# Patient Record
Sex: Female | Born: 1940
Health system: Southern US, Community
[De-identification: ages and names within clinical notes are randomized; demographics above are authoritative.]

## PROBLEM LIST (undated history)

## (undated) DIAGNOSIS — E119 Type 2 diabetes mellitus without complications: Secondary | ICD-10-CM

## (undated) DIAGNOSIS — F32A Depression, unspecified: Secondary | ICD-10-CM

## (undated) DIAGNOSIS — I10 Essential (primary) hypertension: Secondary | ICD-10-CM

## (undated) DIAGNOSIS — F329 Major depressive disorder, single episode, unspecified: Secondary | ICD-10-CM

## (undated) DIAGNOSIS — E7800 Pure hypercholesterolemia, unspecified: Secondary | ICD-10-CM

## (undated) HISTORY — DX: Essential (primary) hypertension: I10

## (undated) HISTORY — DX: Major depressive disorder, single episode, unspecified: F32.9

## (undated) HISTORY — PX: REDUCTION MAMMAPLASTY: SUR839

## (undated) HISTORY — DX: Depression, unspecified: F32.A

## (undated) HISTORY — DX: Pure hypercholesterolemia, unspecified: E78.00

## (undated) HISTORY — DX: Type 2 diabetes mellitus without complications: E11.9

---

## 1978-03-01 HISTORY — PX: BREAST REDUCTION SURGERY: SHX8

## 1989-03-01 HISTORY — PX: REFRACTIVE SURGERY: SHX103

## 1990-03-01 HISTORY — PX: VAGINAL HYSTERECTOMY: SHX2639

## 1997-06-28 ENCOUNTER — Other Ambulatory Visit: Admission: RE | Admit: 1997-06-28 | Discharge: 1997-06-28 | Payer: Self-pay | Admitting: Obstetrics and Gynecology

## 1999-01-28 ENCOUNTER — Encounter: Admission: RE | Admit: 1999-01-28 | Discharge: 1999-01-28 | Payer: Self-pay | Admitting: Obstetrics and Gynecology

## 1999-01-28 ENCOUNTER — Encounter: Payer: Self-pay | Admitting: Obstetrics and Gynecology

## 1999-03-16 ENCOUNTER — Encounter: Payer: Self-pay | Admitting: Gynecology

## 1999-03-23 ENCOUNTER — Inpatient Hospital Stay (HOSPITAL_COMMUNITY): Admission: RE | Admit: 1999-03-23 | Discharge: 1999-03-25 | Payer: Self-pay | Admitting: Obstetrics and Gynecology

## 2000-01-19 ENCOUNTER — Encounter: Admission: RE | Admit: 2000-01-19 | Discharge: 2000-01-19 | Payer: Self-pay | Admitting: Obstetrics and Gynecology

## 2000-01-19 ENCOUNTER — Encounter: Payer: Self-pay | Admitting: Obstetrics and Gynecology

## 2001-01-04 ENCOUNTER — Encounter: Admission: RE | Admit: 2001-01-04 | Discharge: 2001-01-04 | Payer: Self-pay | Admitting: Rheumatology

## 2001-01-04 ENCOUNTER — Encounter: Payer: Self-pay | Admitting: Specialist

## 2001-01-09 ENCOUNTER — Ambulatory Visit (HOSPITAL_BASED_OUTPATIENT_CLINIC_OR_DEPARTMENT_OTHER): Admission: RE | Admit: 2001-01-09 | Discharge: 2001-01-10 | Payer: Self-pay | Admitting: Specialist

## 2002-01-17 ENCOUNTER — Encounter: Admission: RE | Admit: 2002-01-17 | Discharge: 2002-01-17 | Payer: Self-pay | Admitting: Obstetrics and Gynecology

## 2002-01-17 ENCOUNTER — Encounter: Payer: Self-pay | Admitting: Obstetrics and Gynecology

## 2003-03-13 ENCOUNTER — Encounter: Admission: RE | Admit: 2003-03-13 | Discharge: 2003-03-13 | Payer: Self-pay | Admitting: Obstetrics and Gynecology

## 2004-08-07 ENCOUNTER — Emergency Department (HOSPITAL_COMMUNITY): Admission: EM | Admit: 2004-08-07 | Discharge: 2004-08-07 | Payer: Self-pay | Admitting: Emergency Medicine

## 2005-01-06 ENCOUNTER — Ambulatory Visit (HOSPITAL_COMMUNITY): Admission: RE | Admit: 2005-01-06 | Discharge: 2005-01-06 | Payer: Self-pay | Admitting: Obstetrics and Gynecology

## 2005-08-06 ENCOUNTER — Emergency Department (HOSPITAL_COMMUNITY): Admission: EM | Admit: 2005-08-06 | Discharge: 2005-08-06 | Payer: Self-pay | Admitting: Family Medicine

## 2006-02-01 ENCOUNTER — Ambulatory Visit (HOSPITAL_COMMUNITY): Admission: RE | Admit: 2006-02-01 | Discharge: 2006-02-01 | Payer: Self-pay | Admitting: Obstetrics and Gynecology

## 2006-06-23 ENCOUNTER — Encounter: Admission: RE | Admit: 2006-06-23 | Discharge: 2006-06-23 | Payer: Self-pay | Admitting: Internal Medicine

## 2007-03-08 ENCOUNTER — Ambulatory Visit (HOSPITAL_COMMUNITY): Admission: RE | Admit: 2007-03-08 | Discharge: 2007-03-08 | Payer: Self-pay | Admitting: Obstetrics and Gynecology

## 2007-07-11 ENCOUNTER — Other Ambulatory Visit: Admission: RE | Admit: 2007-07-11 | Discharge: 2007-07-11 | Payer: Self-pay | Admitting: Cardiology

## 2008-11-15 ENCOUNTER — Ambulatory Visit (HOSPITAL_COMMUNITY): Admission: RE | Admit: 2008-11-15 | Discharge: 2008-11-15 | Payer: Self-pay | Admitting: Internal Medicine

## 2009-05-02 ENCOUNTER — Emergency Department (HOSPITAL_COMMUNITY): Admission: EM | Admit: 2009-05-02 | Discharge: 2009-05-02 | Payer: Self-pay | Admitting: Emergency Medicine

## 2009-12-11 ENCOUNTER — Ambulatory Visit (HOSPITAL_COMMUNITY): Admission: RE | Admit: 2009-12-11 | Discharge: 2009-12-11 | Payer: Self-pay | Admitting: Internal Medicine

## 2010-07-26 ENCOUNTER — Inpatient Hospital Stay (INDEPENDENT_AMBULATORY_CARE_PROVIDER_SITE_OTHER)
Admission: RE | Admit: 2010-07-26 | Discharge: 2010-07-26 | Disposition: A | Discharge status: Home / Self Care | Payer: Medicare Other | Source: Ambulatory Visit | Attending: Emergency Medicine | Admitting: Emergency Medicine

## 2010-07-26 DIAGNOSIS — R509 Fever, unspecified: Secondary | ICD-10-CM

## 2010-07-26 LAB — DIFFERENTIAL
Basophils Absolute: 0 10*3/uL (ref 0.0–0.1)
Basophils Relative: 0 % (ref 0–1)
Eosinophils Relative: 2 % (ref 0–5)
Lymphs Abs: 2 10*3/uL (ref 0.7–4.0)
Monocytes Absolute: 0.7 10*3/uL (ref 0.1–1.0)

## 2010-07-26 LAB — CBC
HCT: 39.8 % (ref 36.0–46.0)
Hemoglobin: 13.5 g/dL (ref 12.0–15.0)
Platelets: 222 10*3/uL (ref 150–400)

## 2010-07-26 LAB — POCT URINALYSIS DIP (DEVICE)
Glucose, UA: NEGATIVE mg/dL
Nitrite: NEGATIVE
Protein, ur: NEGATIVE mg/dL
pH: 5 (ref 5.0–8.0)

## 2010-12-08 ENCOUNTER — Other Ambulatory Visit (HOSPITAL_COMMUNITY): Payer: Self-pay | Admitting: Internal Medicine

## 2010-12-08 DIAGNOSIS — Z1231 Encounter for screening mammogram for malignant neoplasm of breast: Secondary | ICD-10-CM

## 2010-12-29 ENCOUNTER — Ambulatory Visit (HOSPITAL_COMMUNITY)
Admission: RE | Admit: 2010-12-29 | Discharge: 2010-12-29 | Disposition: A | Discharge status: Home / Self Care | Payer: Medicare Other | Source: Ambulatory Visit | Attending: Internal Medicine | Admitting: Internal Medicine

## 2010-12-29 DIAGNOSIS — Z1231 Encounter for screening mammogram for malignant neoplasm of breast: Secondary | ICD-10-CM

## 2012-08-04 ENCOUNTER — Other Ambulatory Visit: Payer: Self-pay | Admitting: Gastroenterology

## 2012-08-04 DIAGNOSIS — R1310 Dysphagia, unspecified: Secondary | ICD-10-CM

## 2012-08-09 ENCOUNTER — Ambulatory Visit
Admission: RE | Admit: 2012-08-09 | Discharge: 2012-08-09 | Disposition: A | Discharge status: Home / Self Care | Payer: Medicare Other | Source: Ambulatory Visit | Attending: Gastroenterology | Admitting: Gastroenterology

## 2012-08-09 DIAGNOSIS — R1310 Dysphagia, unspecified: Secondary | ICD-10-CM

## 2013-11-15 ENCOUNTER — Ambulatory Visit: Payer: Medicare HMO | Attending: Internal Medicine | Admitting: Physical Therapy

## 2013-11-15 DIAGNOSIS — M545 Low back pain, unspecified: Secondary | ICD-10-CM | POA: Insufficient documentation

## 2013-11-15 DIAGNOSIS — IMO0001 Reserved for inherently not codable concepts without codable children: Secondary | ICD-10-CM | POA: Diagnosis present

## 2013-11-22 ENCOUNTER — Ambulatory Visit: Payer: Medicare HMO | Admitting: Physical Therapy

## 2013-11-22 DIAGNOSIS — IMO0001 Reserved for inherently not codable concepts without codable children: Secondary | ICD-10-CM | POA: Diagnosis not present

## 2013-11-26 ENCOUNTER — Ambulatory Visit: Payer: Medicare HMO | Admitting: Physical Therapy

## 2013-11-26 DIAGNOSIS — IMO0001 Reserved for inherently not codable concepts without codable children: Secondary | ICD-10-CM | POA: Diagnosis not present

## 2013-11-28 ENCOUNTER — Encounter: Payer: Medicare Other | Admitting: Physical Therapy

## 2013-12-03 ENCOUNTER — Ambulatory Visit: Payer: Medicare HMO | Attending: Internal Medicine | Admitting: Rehabilitation

## 2013-12-03 DIAGNOSIS — Z5189 Encounter for other specified aftercare: Secondary | ICD-10-CM | POA: Insufficient documentation

## 2013-12-03 DIAGNOSIS — M545 Low back pain: Secondary | ICD-10-CM | POA: Insufficient documentation

## 2013-12-05 ENCOUNTER — Encounter: Payer: Medicare Other | Admitting: Physical Therapy

## 2013-12-06 ENCOUNTER — Encounter: Payer: Self-pay | Admitting: Internal Medicine

## 2013-12-10 ENCOUNTER — Ambulatory Visit: Payer: Medicare HMO | Admitting: Physical Therapy

## 2013-12-10 DIAGNOSIS — Z5189 Encounter for other specified aftercare: Secondary | ICD-10-CM | POA: Diagnosis not present

## 2013-12-12 ENCOUNTER — Ambulatory Visit: Payer: Medicare HMO | Admitting: Physical Therapy

## 2013-12-12 DIAGNOSIS — Z5189 Encounter for other specified aftercare: Secondary | ICD-10-CM | POA: Diagnosis not present

## 2013-12-24 ENCOUNTER — Encounter: Payer: Medicare HMO | Admitting: Physical Therapy

## 2013-12-27 ENCOUNTER — Ambulatory Visit: Payer: Medicare HMO | Admitting: Physical Therapy

## 2013-12-27 DIAGNOSIS — Z5189 Encounter for other specified aftercare: Secondary | ICD-10-CM | POA: Diagnosis not present

## 2014-09-10 ENCOUNTER — Other Ambulatory Visit: Payer: Self-pay | Admitting: Internal Medicine

## 2014-09-10 DIAGNOSIS — N644 Mastodynia: Secondary | ICD-10-CM

## 2014-09-13 ENCOUNTER — Ambulatory Visit
Admission: RE | Admit: 2014-09-13 | Discharge: 2014-09-13 | Disposition: A | Discharge status: Home / Self Care | Payer: Medicare HMO | Source: Ambulatory Visit | Attending: Internal Medicine | Admitting: Internal Medicine

## 2014-09-13 ENCOUNTER — Ambulatory Visit
Admission: RE | Admit: 2014-09-13 | Discharge: 2014-09-13 | Disposition: A | Discharge status: Home / Self Care | Payer: Commercial Managed Care - HMO | Source: Ambulatory Visit | Attending: Internal Medicine | Admitting: Internal Medicine

## 2014-09-13 DIAGNOSIS — N644 Mastodynia: Secondary | ICD-10-CM

## 2015-02-24 ENCOUNTER — Encounter (HOSPITAL_COMMUNITY): Payer: Self-pay

## 2015-02-24 ENCOUNTER — Emergency Department (HOSPITAL_COMMUNITY)
Admission: EM | Admit: 2015-02-24 | Discharge: 2015-02-24 | Disposition: A | Discharge status: Home / Self Care | Payer: Commercial Managed Care - HMO | Source: Home / Self Care

## 2015-02-24 ENCOUNTER — Emergency Department (HOSPITAL_COMMUNITY): Payer: Commercial Managed Care - HMO

## 2015-02-24 ENCOUNTER — Encounter (HOSPITAL_COMMUNITY): Payer: Self-pay | Admitting: *Deleted

## 2015-02-24 ENCOUNTER — Emergency Department (HOSPITAL_COMMUNITY)
Admission: EM | Admit: 2015-02-24 | Discharge: 2015-02-24 | Disposition: A | Discharge status: Home / Self Care | Payer: Commercial Managed Care - HMO | Attending: Emergency Medicine | Admitting: Emergency Medicine

## 2015-02-24 DIAGNOSIS — R030 Elevated blood-pressure reading, without diagnosis of hypertension: Secondary | ICD-10-CM | POA: Diagnosis not present

## 2015-02-24 DIAGNOSIS — IMO0001 Reserved for inherently not codable concepts without codable children: Secondary | ICD-10-CM

## 2015-02-24 DIAGNOSIS — Z7982 Long term (current) use of aspirin: Secondary | ICD-10-CM | POA: Insufficient documentation

## 2015-02-24 DIAGNOSIS — Z880 Allergy status to penicillin: Secondary | ICD-10-CM | POA: Insufficient documentation

## 2015-02-24 DIAGNOSIS — H9319 Tinnitus, unspecified ear: Secondary | ICD-10-CM

## 2015-02-24 DIAGNOSIS — I10 Essential (primary) hypertension: Secondary | ICD-10-CM | POA: Diagnosis not present

## 2015-02-24 DIAGNOSIS — R51 Headache: Secondary | ICD-10-CM | POA: Insufficient documentation

## 2015-02-24 DIAGNOSIS — H9312 Tinnitus, left ear: Secondary | ICD-10-CM | POA: Insufficient documentation

## 2015-02-24 DIAGNOSIS — Z79899 Other long term (current) drug therapy: Secondary | ICD-10-CM | POA: Diagnosis not present

## 2015-02-24 DIAGNOSIS — R519 Headache, unspecified: Secondary | ICD-10-CM

## 2015-02-24 LAB — CBG MONITORING, ED: Glucose-Capillary: 118 mg/dL — ABNORMAL HIGH (ref 65–99)

## 2015-02-24 MED ORDER — HYDRALAZINE HCL 20 MG/ML IJ SOLN
10.0000 mg | Freq: Once | INTRAMUSCULAR | Status: AC
  Administered 2015-02-24: 10 mg via INTRAVENOUS
  Filled 2015-02-24: qty 1

## 2015-02-24 MED ORDER — SODIUM CHLORIDE 0.9 % IV BOLUS (SEPSIS)
1000.0000 mL | Freq: Once | INTRAVENOUS | Status: AC
  Administered 2015-02-24: 1000 mL via INTRAVENOUS

## 2015-02-24 MED ORDER — DIPHENHYDRAMINE HCL 50 MG/ML IJ SOLN
25.0000 mg | Freq: Once | INTRAMUSCULAR | Status: AC
  Administered 2015-02-24: 25 mg via INTRAVENOUS
  Filled 2015-02-24: qty 1

## 2015-02-24 MED ORDER — DEXAMETHASONE SODIUM PHOSPHATE 10 MG/ML IJ SOLN
10.0000 mg | Freq: Once | INTRAMUSCULAR | Status: AC
  Administered 2015-02-24: 10 mg via INTRAVENOUS
  Filled 2015-02-24: qty 1

## 2015-02-24 MED ORDER — PROCHLORPERAZINE EDISYLATE 5 MG/ML IJ SOLN
10.0000 mg | Freq: Four times a day (QID) | INTRAMUSCULAR | Status: DC | PRN
  Administered 2015-02-24: 10 mg via INTRAVENOUS
  Filled 2015-02-24: qty 2

## 2015-02-24 NOTE — ED Notes (Signed)
C/o has had episodic HA top of head, feels as if she " has been hit w a hammer", but denies trauma. Progressively worse ringing in her ears. Denies vomiting, denies LOC, loss of balance

## 2015-02-24 NOTE — ED Notes (Signed)
MD advised pt's headache got better and then came back. BP better. No further orders received at this time

## 2015-02-24 NOTE — Discharge Instructions (Signed)
You should go to the ER and be seen for your headache.  You may require additional tests based on the ED providers review of your symptoms.

## 2015-02-24 NOTE — ED Notes (Signed)
Pt returned from CT. Pt placed on monitor. Pt remains monitored by blood pressure and pulse ox.

## 2015-02-24 NOTE — Discharge Instructions (Signed)
Hypertension Hypertension, commonly called high blood pressure, is when the force of blood pumping through your arteries is too strong. Your arteries are the blood vessels that carry blood from your heart throughout your body. A blood pressure reading consists of a higher number over a lower number, such as 110/72. The higher number (systolic) is the pressure inside your arteries when your heart pumps. The lower number (diastolic) is the pressure inside your arteries when your heart relaxes. Ideally you want your blood pressure below 120/80. Hypertension forces your heart to work harder to pump blood. Your arteries may become narrow or stiff. Having untreated or uncontrolled hypertension can cause heart attack, stroke, kidney disease, and other problems. RISK FACTORS Some risk factors for high blood pressure are controllable. Others are not.  Risk factors you cannot control include:   Race. You may be at higher risk if you are African American.  Age. Risk increases with age.  Gender. Men are at higher risk than women before age 45 years. After age 65, women are at higher risk than men. Risk factors you can control include:  Not getting enough exercise or physical activity.  Being overweight.  Getting too much fat, sugar, calories, or salt in your diet.  Drinking too much alcohol. SIGNS AND SYMPTOMS Hypertension does not usually cause signs or symptoms. Extremely high blood pressure (hypertensive crisis) may cause headache, anxiety, shortness of breath, and nosebleed. DIAGNOSIS To check if you have hypertension, your health care provider will measure your blood pressure while you are seated, with your arm held at the level of your heart. It should be measured at least twice using the same arm. Certain conditions can cause a difference in blood pressure between your right and left arms. A blood pressure reading that is higher than normal on one occasion does not mean that you need treatment. If  it is not clear whether you have high blood pressure, you may be asked to return on a different day to have your blood pressure checked again. Or, you may be asked to monitor your blood pressure at home for 1 or more weeks. TREATMENT Treating high blood pressure includes making lifestyle changes and possibly taking medicine. Living a healthy lifestyle can help lower high blood pressure. You may need to change some of your habits. Lifestyle changes may include:  Following the DASH diet. This diet is high in fruits, vegetables, and whole grains. It is low in salt, red meat, and added sugars.  Keep your sodium intake below 2,300 mg per day.  Getting at least 30-45 minutes of aerobic exercise at least 4 times per week.  Losing weight if necessary.  Not smoking.  Limiting alcoholic beverages.  Learning ways to reduce stress. Your health care provider may prescribe medicine if lifestyle changes are not enough to get your blood pressure under control, and if one of the following is true:  You are 18-59 years of age and your systolic blood pressure is above 140.  You are 60 years of age or older, and your systolic blood pressure is above 150.  Your diastolic blood pressure is above 90.  You have diabetes, and your systolic blood pressure is over 140 or your diastolic blood pressure is over 90.  You have kidney disease and your blood pressure is above 140/90.  You have heart disease and your blood pressure is above 140/90. Your personal target blood pressure may vary depending on your medical conditions, your age, and other factors. HOME CARE INSTRUCTIONS    Have your blood pressure rechecked as directed by your health care provider.   Take medicines only as directed by your health care provider. Follow the directions carefully. Blood pressure medicines must be taken as prescribed. The medicine does not work as well when you skip doses. Skipping doses also puts you at risk for  problems.  Do not smoke.   Monitor your blood pressure at home as directed by your health care provider. SEEK MEDICAL CARE IF:   You think you are having a reaction to medicines taken.  You have recurrent headaches or feel dizzy.  You have swelling in your ankles.  You have trouble with your vision. SEEK IMMEDIATE MEDICAL CARE IF:  You develop a severe headache or confusion.  You have unusual weakness, numbness, or feel faint.  You have severe chest or abdominal pain.  You vomit repeatedly.  You have trouble breathing. MAKE SURE YOU:   Understand these instructions.  Will watch your condition.  Will get help right away if you are not doing well or get worse.   This information is not intended to replace advice given to you by your health care provider. Make sure you discuss any questions you have with your health care provider.   Document Released: 02/15/2005 Document Revised: 07/02/2014 Document Reviewed: 12/08/2012 Elsevier Interactive Patient Education 2016 [employer]  General Headache Without Cause A headache is pain or discomfort felt around the head or neck area. The specific cause of a headache may not be found. There are many causes and types of headaches. A few common ones are:  Tension headaches.  Migraine headaches.  Cluster headaches.  Chronic daily headaches. HOME CARE INSTRUCTIONS  Watch your condition for any changes. Take these steps to help with your condition: Managing Pain  Take over-the-counter and prescription medicines only as told by your health care provider.  Lie down in a dark, quiet room when you have a headache.  If directed, apply ice to the head and neck area:  Put ice in a plastic bag.  Place a towel between your skin and the bag.  Leave the ice on for 20 minutes, 2-3 times per day.  Use a heating pad or hot shower to apply heat to the head and neck area as told by your health care provider.  Keep lights dim if  bright lights bother you or make your headaches worse. Eating and Drinking  Eat meals on a regular schedule.  Limit alcohol use.  Decrease the amount of caffeine you drink, or stop drinking caffeine. General Instructions  Keep all follow-up visits as told by your health care provider. This is important.  Keep a headache journal to help find out what may trigger your headaches. For example, write down:  What you eat and drink.  How much sleep you get.  Any change to your diet or medicines.  Try massage or other relaxation techniques.  Limit stress.  Sit up straight, and do not tense your muscles.  Do not use tobacco products, including cigarettes, chewing tobacco, or e-cigarettes. If you need help quitting, ask your health care provider.  Exercise regularly as told by your health care provider.  Sleep on a regular schedule. Get 7-9 hours of sleep, or the amount recommended by your health care provider. SEEK MEDICAL CARE IF:   Your symptoms are not helped by medicine.  You have a headache that is different from the usual headache.  You have nausea or you vomit.  You have a fever.  SEEK IMMEDIATE MEDICAL CARE IF:   Your headache becomes severe.  You have repeated vomiting.  You have a stiff neck.  You have a loss of vision.  You have problems with speech.  You have pain in the eye or ear.  You have muscular weakness or loss of muscle control.  You lose your balance or have trouble walking.  You feel faint or pass out.  You have confusion.   This information is not intended to replace advice given to you by your health care provider. Make sure you discuss any questions you have with your health care provider.   Document Released: 02/15/2005 Document Revised: 11/06/2014 Document Reviewed: 06/10/2014 Elsevier Interactive Patient Education 2016 [employer].  Managing Your High Blood Pressure Blood pressure is a measurement of how forceful your blood is  pressing against the walls of the arteries. Arteries are muscular tubes within the circulatory system. Blood pressure does not stay the same. Blood pressure rises when you are active, excited, or nervous; and it lowers during sleep and relaxation. If the numbers measuring your blood pressure stay above normal most of the time, you are at risk for health problems. High blood pressure (hypertension) is a long-term (chronic) condition in which blood pressure is elevated. A blood pressure reading is recorded as two numbers, such as 120 over 80 (or 120/80). The first, higher number is called the systolic pressure. It is a measure of the pressure in your arteries as the heart beats. The second, lower number is called the diastolic pressure. It is a measure of the pressure in your arteries as the heart relaxes between beats.  Keeping your blood pressure in a normal range is important to your overall health and prevention of health problems, such as heart disease and stroke. When your blood pressure is uncontrolled, your heart has to work harder than normal. High blood pressure is a very common condition in adults because blood pressure tends to rise with age. Men and women are equally likely to have hypertension but at different times in life. Before age 20, men are more likely to have hypertension. After 76 years of age, women are more likely to have it. Hypertension is especially common in African Americans. This condition often has no signs or symptoms. The cause of the condition is usually not known. Your caregiver can help you come up with a plan to keep your blood pressure in a normal, healthy range. BLOOD PRESSURE STAGES Blood pressure is classified into four stages: normal, prehypertension, stage 1, and stage 2. Your blood pressure reading will be used to determine what type of treatment, if any, is necessary. Appropriate treatment options are tied to these four stages:  Normal  Systolic pressure (mm Hg):  below 120.  Diastolic pressure (mm Hg): below 80. Prehypertension  Systolic pressure (mm Hg): 120 to 139.  Diastolic pressure (mm Hg): 80 to 89. Stage1  Systolic pressure (mm Hg): 140 to 159.  Diastolic pressure (mm Hg): 90 to 99. Stage2  Systolic pressure (mm Hg): 160 or above.  Diastolic pressure (mm Hg): 100 or above. RISKS RELATED TO HIGH BLOOD PRESSURE Managing your blood pressure is an important responsibility. Uncontrolled high blood pressure can lead to:  A heart attack.  A stroke.  A weakened blood vessel (aneurysm).  Heart failure.  Kidney damage.  Eye damage.  Metabolic syndrome.  Memory and concentration problems. HOW TO MANAGE YOUR BLOOD PRESSURE Blood pressure can be managed effectively with lifestyle changes and medicines (if needed).  Your caregiver will help you come up with a plan to bring your blood pressure within a normal range. Your plan should include the following: Education  Read all information provided by your caregivers about how to control blood pressure.  Educate yourself on the latest guidelines and treatment recommendations. New research is always being done to further define the risks and treatments for high blood pressure. Lifestylechanges  Control your weight.  Avoid smoking.  Stay physically active.  Reduce the amount of salt in your diet.  Reduce stress.  Control any chronic conditions, such as high cholesterol or diabetes.  Reduce your alcohol intake. Medicines  Several medicines (antihypertensive medicines) are available, if needed, to bring blood pressure within a normal range. Communication  Review all the medicines you take with your caregiver because there may be side effects or interactions.  Talk with your caregiver about your diet, exercise habits, and other lifestyle factors that may be contributing to high blood pressure.  See your caregiver regularly. Your caregiver can help you create and adjust  your plan for managing high blood pressure. RECOMMENDATIONS FOR TREATMENT AND FOLLOW-UP  The following recommendations are based on current guidelines for managing high blood pressure in nonpregnant adults. Use these recommendations to identify the proper follow-up period or treatment option based on your blood pressure reading. You can discuss these options with your caregiver.  Systolic pressure of 120 to 139 or diastolic pressure of 80 to 89: Follow up with your caregiver as directed.  Systolic pressure of 140 to 160 or diastolic pressure of 90 to 100: Follow up with your caregiver within 2 months.  Systolic pressure above 160 or diastolic pressure above 100: Follow up with your caregiver within 1 month.  Systolic pressure above 180 or diastolic pressure above 110: Consider antihypertensive therapy; follow up with your caregiver within 1 week.  Systolic pressure above 200 or diastolic pressure above 120: Begin antihypertensive therapy; follow up with your caregiver within 1 week.   This information is not intended to replace advice given to you by your health care provider. Make sure you discuss any questions you have with your health care provider.   Document Released: 11/10/2011 Document Reviewed: 11/10/2011 Elsevier Interactive Patient Education [employer]  Tinnitus Tinnitus refers to hearing a sound when there is no actual source for that sound. This is often described as ringing in the ears. However, people with this condition may hear a variety of noises. A person may hear the sound in one ear or in both ears.  The sounds of tinnitus can be soft, loud, or somewhere in between. Tinnitus can last for a few seconds or can be constant for days. It may go away without treatment and come back at various times. When tinnitus is constant or happens often, it can lead to other problems, such as trouble sleeping and trouble concentrating. Almost everyone experiences tinnitus at some  point. Tinnitus that is long-lasting (chronic) or comes back often is a problem that may require medical attention.  CAUSES  The cause of tinnitus is often not known. In some cases, it can result from other problems or conditions, including:   Exposure to loud noises from machinery, music, or other sources.  Hearing loss.  Ear or sinus infections.  Earwax buildup.  A foreign object in the ear.  Use of certain medicines.  Use of alcohol and caffeine.  High blood pressure.  Heart diseases.  Anemia.  Allergies.  Meniere disease.  Thyroid problems.  Tumors.  An enlarged part of a weakened blood vessel (aneurysm). SYMPTOMS The main symptom of tinnitus is hearing a sound when there is no source for that sound. It may sound like:   Buzzing.  Roaring.  Ringing.  Blowing air, similar to the sound heard when you listen to a seashell.  Hissing.  Whistling.  Sizzling.  Humming.  Running water.  A sustained musical note. DIAGNOSIS  Tinnitus is diagnosed based on your symptoms. Your health care provider will do a physical exam. A comprehensive hearing exam (audiologic exam) will be done if your tinnitus:   Affects only one ear (unilateral).  Causes hearing difficulties.  Lasts 6 months or longer. You may also need to see a health care provider who specializes in hearing disorders (audiologist). You may be asked to complete a questionnaire to determine the severity of your tinnitus. Tests may be done to help determine the cause and to rule out other conditions. These can include:  Imaging studies of your head and brain, such as:  A CT scan.  An MRI.  An imaging study of your blood vessels (angiogram). TREATMENT  Treating an underlying medical condition can sometimes make tinnitus go away. If your tinnitus continues, other treatments may include:  Medicines, such as certain antidepressants or sleeping aids.  Sound generators to mask the tinnitus. These  include:  Tabletop sound machines that play relaxing sounds to help you fall asleep.  Wearable devices that fit in your ear and play sounds or music.  A small device that uses headphones to deliver a signal embedded in music (acoustic neural stimulation). In time, this may change the pathways of your brain and make you less sensitive to tinnitus. This device is used for very severe cases when no other treatment is working.  Therapy and counseling to help you manage the stress of living with tinnitus.  Using hearing aids or cochlear implants, if your tinnitus is related to hearing loss. HOME CARE INSTRUCTIONS  When possible, avoid being in loud places and being exposed to loud sounds.  Wear hearing protection, such as earplugs, when you are exposed to loud noises.  Do not take stimulants, such as nicotine, alcohol, or caffeine.  Practice techniques for reducing stress, such as meditation, yoga, or deep breathing.  Use a white noise machine, a humidifier, or other devices to mask the sound of tinnitus.  Sleep with your head slightly raised. This may reduce the impact of tinnitus.  Try to get plenty of rest each night. SEEK MEDICAL CARE IF:  You have tinnitus in just one ear.  Your tinnitus continues for 3 weeks or longer without stopping.  Home care measures are not helping.  You have tinnitus after a head injury.  You have tinnitus along with any of the following:  Dizziness.  Loss of balance.  Nausea and vomiting.   This information is not intended to replace advice given to you by your health care provider. Make sure you discuss any questions you have with your health care provider.   Document Released: 02/15/2005 Document Revised: 03/08/2014 Document Reviewed: 07/18/2013 Elsevier Interactive Patient Education [employer] Inc[employer]

## 2015-02-24 NOTE — ED Notes (Signed)
Dr. Seymore at the bedside.  

## 2015-02-24 NOTE — ED Notes (Signed)
Pt going to CT

## 2015-02-24 NOTE — ED Notes (Signed)
Pt placed into gown and on monitor upon arrival to room. Pt monitored by blood pressure and pulse ox.  

## 2015-02-24 NOTE — ED Notes (Signed)
Pt coming from Urgent Care with c/o headache. Pt reports intermittent "ringing in her ear" along with a left eye twitch. Pt states she always has ringing in her ears but is much more pronounced. Pt states her head "just feels like a dull ache, not a severe ache." Pt denies taking any OTC medication for the pain. Pt denies any neuro changes. Pt ambulates with steady gait, grips equal and strong, speech is clear.

## 2015-02-24 NOTE — ED Provider Notes (Signed)
The patient is a 74 year old female, she presents with a couple of complaints including headache, hypertension, tinnitus which is not a new problem. Blood pressure is 185 systolic.  The patient has recently started a new blood pressure medication that she cannot remember the name of it. She had to stop lisinopril because of a cough. On exam she has a normal neurologic exam, no other complaints, clear heart and lung sounds.  Blood pressure control  CT brain  Reevaluate after headache medications given.  I saw and evaluated the patient, reviewed the resident's note and I agree with the findings and plan.  Final diagnoses:  Elevated BP  Acute nonintractable headache, unspecified headache type  Tinnitus, left      Eber HongBrian Nora Rooke, MD 02/25/15 1055

## 2015-02-24 NOTE — ED Provider Notes (Signed)
CSN: [id number]     Arrival date & time 02/24/15  1613 History   First MD Initiated Contact with Patient 02/24/15 2028     Chief Complaint  Patient presents with  . Headache     (Consider location/radiation/quality/duration/timing/severity/associated sxs/prior Treatment) Patient is a 74 y.o. female presenting with headaches. The history is provided by the patient.  Headache Pain location:  Generalized Quality:  Dull Radiates to:  Does not radiate Severity currently:  7/10 Severity at highest:  7/10 Onset quality:  Gradual Duration:  1 day Timing:  Constant Progression:  Worsening Chronicity:  New Context: not caffeine, not coughing, not eating, not exposure to cold air, not intercourse, not loud noise and not straining   Relieved by:  None tried Worsened by:  Nothing Ineffective treatments:  Aspirin Associated symptoms: no abdominal pain, no back pain, no blurred vision, no congestion, no cough, no diarrhea, no dizziness, no drainage, no ear pain, no focal weakness, no hearing loss, no myalgias, no nausea, no neck stiffness, no numbness, no paresthesias, no photophobia, no seizures, no sinus pressure, no sore throat, no swollen glands, no syncope, no tingling, no URI, no visual change, no vomiting and no weakness   Associated symptoms comment:  Tinnitus and left eye twitching Risk factors: no anger and no family hx of SAH     History reviewed. No pertinent past medical history. History reviewed. No pertinent past surgical history. History reviewed. No pertinent family history. Social History  Substance Use Topics  . Smoking status: Never Smoker   . Smokeless tobacco: None  . Alcohol Use: No   OB History    No data available     Review of Systems  HENT: Negative for congestion, ear pain, hearing loss, postnasal drip, sinus pressure and sore throat.   Eyes: Negative for blurred vision and photophobia.  Respiratory: Negative for cough.   Cardiovascular: Negative for  syncope.  Gastrointestinal: Negative for nausea, vomiting, abdominal pain and diarrhea.  Musculoskeletal: Negative for myalgias, back pain and neck stiffness.  Neurological: Positive for headaches. Negative for dizziness, focal weakness, seizures, weakness, numbness and paresthesias.      Allergies  Penicillins  Home Medications   Prior to Admission medications   Medication Sig Start Date End Date Taking? Authorizing Provider  aspirin 81 MG tablet Take 81 mg by mouth daily.   Yes Historical Provider, MD  FLUoxetine (PROZAC) 10 MG capsule Take 10 mg by mouth every morning. 02/08/15  Yes Historical Provider, MD  lisinopril (PRINIVIL,ZESTRIL) 10 MG tablet Take 10 mg by mouth daily. 12/02/14  Yes Historical Provider, MD  losartan (COZAAR) 50 MG tablet Take 50 mg by mouth daily. 01/20/15  Yes Historical Provider, MD  metFORMIN (GLUCOPHAGE) 500 MG tablet Take 500 mg by mouth daily. 02/08/15  Yes Historical Provider, MD  pravastatin (PRAVACHOL) 40 MG tablet Take 40 mg by mouth at bedtime. 02/08/15  Yes Historical Provider, MD   BP 152/52 mmHg  Pulse 81  Temp(Src) 97.6 F (36.4 C) (Oral)  Resp 18  Ht  (1.6 m)  Wt 68.04 kg  BMI 26.58 kg/m2  SpO2 98% Physical Exam  Constitutional: She is oriented to person, place, and time. She appears well-developed and well-nourished. No distress.  HENT:  Head: Normocephalic and atraumatic.  Right Ear: Hearing, tympanic membrane, external ear and ear canal normal.  Left Ear: Hearing, tympanic membrane, external ear and ear canal normal.  Eyes: Conjunctivae and EOM are normal. Pupils are equal, round, and reactive to light.  Neck: Normal range of motion.  Cardiovascular: Normal rate and regular rhythm.  Exam reveals no gallop and no friction rub.   No murmur heard. Pulmonary/Chest: Effort normal and breath sounds normal. No respiratory distress. She has no wheezes. She has no rales. She exhibits no tenderness.  Abdominal: Soft. She exhibits no  distension and no mass. There is no tenderness. There is no rebound and no guarding.  Musculoskeletal: Normal range of motion.  Neurological: She is alert and oriented to person, place, and time. She has normal strength. No cranial nerve deficit or sensory deficit. Coordination normal. GCS eye subscore is 4. GCS verbal subscore is 5. GCS motor subscore is 6.  Normal finger to nose and heel to shin  Skin: Skin is warm and dry. She is not diaphoretic. No erythema.  Psychiatric: She has a normal mood and affect. Her speech is normal and behavior is normal. Cognition and memory are normal.    ED Course  Procedures (including critical care time) Labs Review Labs Reviewed  CBG MONITORING, ED - Abnormal; Notable for the following:    Glucose-Capillary 118 (*)    All other components within normal limits    Imaging Review Ct Head Wo Contrast  02/24/2015  CLINICAL DATA:  74 year old female with headache EXAM: CT HEAD WITHOUT CONTRAST TECHNIQUE: Contiguous axial images were obtained from the base of the skull through the vertex without intravenous contrast. COMPARISON:  CT dated 06/23/2006 FINDINGS: There is slight prominence of the ventricles and sulci compatible with age-related volume loss. Mild periventricular and deep white matter hypodensities represent chronic microvascular ischemic changes. There is no intracranial hemorrhage. No mass effect or midline shift identified. The visualized paranasal sinuses and mastoid air cells are well aerated. The calvarium is intact. IMPRESSION: No acute intracranial hemorrhage. Mild age-related atrophy and chronic microvascular ischemic disease. If symptoms persist and there are no contraindications, MRI may provide better evaluation if clinically indicated Electronically Signed   By: Elgie CollardArash  Radparvar M.D.   On: 02/24/2015 22:36   I have personally reviewed and evaluated these images and lab results as part of my medical decision-making.   EKG  Interpretation None      MDM   Final diagnoses:  Elevated BP  Acute nonintractable headache, unspecified headache type  Tinnitus, left    74 year old Caucasian female past medical history of hypertension presents to the setting of headache, elevated blood pressure, tinnitus. Patient reports she has a history of chronic left ear tinnitus but it has been worse over the last day. She reports she was recently changed in the last several weeks from her ACE inhibitor secondary to cough. She'll does not know the name of her new anti-hypertensive medication. Patient reports today she started having generalized headache and due to this was seen in urgent care. They noted her blood pressure was elevated and she is advised from the emergency department for further evaluation.  On arrival patient is hemodynamically stable but systolics were in the 180s. Patient had no focal neurologic deficits and did note that she had some left eye twitching intermittently. I did not see this on  examination and patient did not report it was currently happening. Patient does report taking aspirin for history of "micro-stroke". Patient denies any vision changes, chest pain, shortness of breath, fevers, rashes, trauma, recent sick contacts or recent travel.  In setting of patient's history we'll obtain CT head to rule out acute stroke. Additionally we'll give migraine cocktail at this time as well as hydralazine for  elevated blood pressure. We'll reassess after interventions. Differential includes intracerebral bleed, tension headache, headache secondary to low blood pressure, hypertensive urgency. We'll reassess patient after migraine cocktail, blood pressure medication, CT head.  CT head did not reveal acute intracerebral abnormality. The patient reported improvement headache with migraine cocktail and blood pressure decreased with IV medications. The patient remained neurovascular intact on examination. In setting his  findings this is likely tension headache versus elevated blood pressure versus hypertensive urgency. Believe patient is safe for discharge home at this time. As patient does not take her next blood pressure medication until the morning advised patient to call PCP in the morning to discuss changes to blood pressure medications. Patient was given strict return precautions and stable at time of discharge. Patient in agreement with plan.  Attending has seen and evaluated patient and Dr. Hyacinth Meeker is in agreement with plan.    Stacy Gardner, MD 02/24/15 6962  Eber Hong, MD 02/25/15 1055

## 2015-02-24 NOTE — ED Notes (Signed)
Dr. Miller at the bedside.  

## 2015-02-24 NOTE — ED Notes (Signed)
CBG collected in triage at 16:41

## 2015-03-18 DIAGNOSIS — H9313 Tinnitus, bilateral: Secondary | ICD-10-CM | POA: Diagnosis not present

## 2015-03-18 DIAGNOSIS — I10 Essential (primary) hypertension: Secondary | ICD-10-CM | POA: Diagnosis not present

## 2015-03-18 DIAGNOSIS — Z1389 Encounter for screening for other disorder: Secondary | ICD-10-CM | POA: Diagnosis not present

## 2015-03-18 DIAGNOSIS — R253 Fasciculation: Secondary | ICD-10-CM | POA: Diagnosis not present

## 2015-03-18 DIAGNOSIS — E7800 Pure hypercholesterolemia, unspecified: Secondary | ICD-10-CM | POA: Diagnosis not present

## 2015-03-27 DIAGNOSIS — H9313 Tinnitus, bilateral: Secondary | ICD-10-CM | POA: Diagnosis not present

## 2015-03-27 DIAGNOSIS — H9113 Presbycusis, bilateral: Secondary | ICD-10-CM | POA: Diagnosis not present

## 2015-03-27 DIAGNOSIS — H903 Sensorineural hearing loss, bilateral: Secondary | ICD-10-CM | POA: Diagnosis not present

## 2015-03-31 ENCOUNTER — Encounter: Payer: Self-pay | Admitting: Neurology

## 2015-03-31 ENCOUNTER — Ambulatory Visit (INDEPENDENT_AMBULATORY_CARE_PROVIDER_SITE_OTHER): Payer: PPO | Admitting: Neurology

## 2015-03-31 VITALS — BP 161/81 | HR 66 | Ht 63.0 in | Wt 156.4 lb

## 2015-03-31 DIAGNOSIS — H9319 Tinnitus, unspecified ear: Secondary | ICD-10-CM | POA: Insufficient documentation

## 2015-03-31 DIAGNOSIS — I10 Essential (primary) hypertension: Secondary | ICD-10-CM

## 2015-03-31 DIAGNOSIS — H9313 Tinnitus, bilateral: Secondary | ICD-10-CM

## 2015-03-31 DIAGNOSIS — R7302 Impaired glucose tolerance (oral): Secondary | ICD-10-CM

## 2015-03-31 DIAGNOSIS — G514 Facial myokymia: Secondary | ICD-10-CM | POA: Diagnosis not present

## 2015-03-31 NOTE — Progress Notes (Signed)
[id number]    Provider:  Dr Lucia Gaskins Referring Provider: Pearson Grippe, MD Primary Care Physician:  Pearson Grippe, MD  CC:  Ringing in the ears for 5 years, eye twitching.   HPI:  Angel Day is a 75 y.o. female here as a referral from Dr. Selena Batten for ringing in the ears for 5 years and twitching in the left eye and then started on the right recently in the last month. PMHx HTN, prediabetes, HLD, depression, Tinnitus. She feels the eyelid moving and it is more in the inner part of the eye, like a little tic that is visible, primary care had seen it.  Does not impair vision. Her BP was elevated and that has been managed and she had a lot of stress around the holidays, her daughter got married and her mother is 78. Her blood pressure was 193 systolic and she went to the ED, CT scan was negative. The twitching is getting better. She is having trouble sleeping. Twitching is more in the left eye, it would twitch on and off al day mostly on th left. No pain, just aggravating. No current headache. The last time she had twitching was yesterday, she had it briefly. The ringing has gotten louder, feels like it is in both ears. She had a hearing test and she has hearing loss. Currently no headaches, no focal weakness, no dysphagia, no aphasia, no vision changes or any other focal neurologic symptoms. She was evaluated by ENT for ringing in the ears.  Reviewed notes, labs and imaging from outside physicians, which showed:  CT of the head 02/24/2015: personally reviewed images and agree with below There is slight prominence of the ventricles and sulci compatible with age-related volume loss. Mild periventricular and deep white matter hypodensities represent chronic microvascular ischemic changes. There is no intracranial hemorrhage. No mass effect or midline shift identified.  The visualized paranasal sinuses and mastoid air cells are well aerated. The calvarium is intact.  IMPRESSION: No  acute intracranial hemorrhage. Mild age-related atrophy and chronic microvascular ischemic disease.  Glucose 02/24/2015 118    Review of Systems: Patient complains of symptoms per HPI as well as the following symptoms: ringing in the ears, incontinence, rash, joint pain, joint swelling, allergies, runny nose, skin sensitivity, depression, not enough sleep, insomnia. Pertinent negatives per HPI. All others negative.   Social History   Social History  . Marital Status: Single    Spouse Name: N/A  . Number of Children: 3  . Years of Education: 14   Occupational History  . Retired- Photographer    Social History Main Topics  . Smoking status: Former Smoker    Quit date: 03/02/1975  . Smokeless tobacco: Not on file  . Alcohol Use: No  . Drug Use: No  . Sexual Activity: Not on file   Other Topics Concern  . Not on file   Social History Narrative   Lives alone    Family History  Problem Relation Age of Onset  . Breast cancer Mother   . Cancer Mother     sarcoma    Past Medical History  Diagnosis Date  . Hypertension   . Depression   . High cholesterol   . Diabetes [facility])     Past Surgical History  Procedure Laterality Date  . Vaginal hysterectomy  1992  . Breast reduction surgery  1980  . Refractive surgery  1991    Current Outpatient Prescriptions  Medication Sig Dispense Refill  . aspirin 81  MG tablet Take 81 mg by mouth daily.    Marland Kitchen CALCIUM PO Take 600 mg by mouth daily.    . Cholecalciferol (VITAMIN D3) 5000 units CAPS Take 1 capsule by mouth daily.    Marland Kitchen FLUoxetine (PROZAC) 10 MG capsule Take 10 mg by mouth every morning.  6  . lisinopril (PRINIVIL,ZESTRIL) 10 MG tablet Take 10 mg by mouth daily.  1  . losartan (COZAAR) 50 MG tablet Take 100 mg by mouth daily.   4  . metFORMIN (GLUCOPHAGE) 500 MG tablet Take 500 mg by mouth daily.  3  . OMEPRAZOLE PO Take 1 tablet by mouth as needed.    . pravastatin (PRAVACHOL) 40 MG tablet Take 40 mg by mouth at bedtime.   6   No current facility-administered medications for this visit.    Allergies as of 03/31/2015 - Review Complete 03/31/2015  Allergen Reaction Noted  . Penicillins  02/24/2015    Vitals: BP 161/81 mmHg  Pulse 66  Ht  (1.6 m)  Wt 156 lb 6.4 oz (70.943 kg)  BMI 27.71 kg/m2 Last Weight:  Wt Readings from Last 1 Encounters:  03/31/15 156 lb 6.4 oz (70.943 kg)   Last Height:   Ht Readings from Last 1 Encounters:  03/31/15  (1.6 m)   Physical exam: Exam: Gen: NAD, conversant, well nourised, well groomed                     CV: RRR, no MRG. No Carotid Bruits. No peripheral edema, warm, nontender Eyes: Conjunctivae clear without exudates or hemorrhage  Neuro: Detailed Neurologic Exam  Speech:    Speech is normal; fluent and spontaneous with normal comprehension.  Cognition:    The patient is oriented to person, place, and time;     recent and remote memory intact;     language fluent;     normal attention, concentration,     fund of knowledge Cranial Nerves:    The pupils are equal, round, and reactive to light. The fundi are flat. Visual fields are full to finger confrontation. Extraocular movements are intact. Trigeminal sensation is intact and the muscles of mastication are normal. The face is symmetric. The palate elevates in the midline. Hearing intact. Voice is normal. Shoulder shrug is normal. The tongue has normal motion without fasciculations.   Coordination:    Normal finger to nose and heel to shin. Normal rapid alternating movements.   Gait:    Heel-toe and tandem gait are normal.   Motor Observation:    No asymmetry, no atrophy, and no involuntary movements noted. Tone:    Normal muscle tone.    Posture:    Posture is normal. normal erect    Strength:    Strength is V/V in the upper and lower limbs.      Sensation: intact to LT     Reflex Exam:  DTR's:    Deep tendon reflexes in the upper and lower extremities are normal bilaterally.     Toes:    The toes are equivocal bilaterally.   Clonus:    Clonus is absent.       Assessment/Plan:  Very lovely 75 year old patient with tinnitus, bilateral eye twitching. Tinnitus for 5 years, following with ENT, had MRI in the past (per patient) without etiology for the tinnitus, recent CT of the head unremarkable, neuro exam normal. Twitching in the eyes improving. Offered an MRi fo the braun, patient prefers to wait. Will follow  clinically.   Naomie Dean, MD  [facility] [address] [place], [place] [address]  Phone [phone] Fax [phone]

## 2015-03-31 NOTE — Patient Instructions (Signed)

## 2015-05-16 DIAGNOSIS — N390 Urinary tract infection, site not specified: Secondary | ICD-10-CM | POA: Diagnosis not present

## 2015-05-16 DIAGNOSIS — I10 Essential (primary) hypertension: Secondary | ICD-10-CM | POA: Diagnosis not present

## 2015-05-16 DIAGNOSIS — R739 Hyperglycemia, unspecified: Secondary | ICD-10-CM | POA: Diagnosis not present

## 2015-05-16 DIAGNOSIS — Z0000 Encounter for general adult medical examination without abnormal findings: Secondary | ICD-10-CM | POA: Diagnosis not present

## 2015-05-29 DIAGNOSIS — E784 Other hyperlipidemia: Secondary | ICD-10-CM | POA: Diagnosis not present

## 2015-05-29 DIAGNOSIS — N390 Urinary tract infection, site not specified: Secondary | ICD-10-CM | POA: Diagnosis not present

## 2015-05-29 DIAGNOSIS — E039 Hypothyroidism, unspecified: Secondary | ICD-10-CM | POA: Diagnosis not present

## 2015-05-29 DIAGNOSIS — I10 Essential (primary) hypertension: Secondary | ICD-10-CM | POA: Diagnosis not present

## 2015-05-29 DIAGNOSIS — Z1389 Encounter for screening for other disorder: Secondary | ICD-10-CM | POA: Diagnosis not present

## 2015-07-18 DIAGNOSIS — H43813 Vitreous degeneration, bilateral: Secondary | ICD-10-CM | POA: Diagnosis not present

## 2015-08-22 DIAGNOSIS — E119 Type 2 diabetes mellitus without complications: Secondary | ICD-10-CM | POA: Diagnosis not present

## 2015-08-22 DIAGNOSIS — E784 Other hyperlipidemia: Secondary | ICD-10-CM | POA: Diagnosis not present

## 2015-08-22 DIAGNOSIS — E039 Hypothyroidism, unspecified: Secondary | ICD-10-CM | POA: Diagnosis not present

## 2015-08-22 DIAGNOSIS — I10 Essential (primary) hypertension: Secondary | ICD-10-CM | POA: Diagnosis not present

## 2015-08-29 DIAGNOSIS — R739 Hyperglycemia, unspecified: Secondary | ICD-10-CM | POA: Diagnosis not present

## 2015-08-29 DIAGNOSIS — E784 Other hyperlipidemia: Secondary | ICD-10-CM | POA: Diagnosis not present

## 2015-08-29 DIAGNOSIS — Z0001 Encounter for general adult medical examination with abnormal findings: Secondary | ICD-10-CM | POA: Diagnosis not present

## 2015-08-29 DIAGNOSIS — I10 Essential (primary) hypertension: Secondary | ICD-10-CM | POA: Diagnosis not present

## 2015-08-29 DIAGNOSIS — E039 Hypothyroidism, unspecified: Secondary | ICD-10-CM | POA: Diagnosis not present

## 2015-10-03 ENCOUNTER — Other Ambulatory Visit: Payer: Self-pay | Admitting: Internal Medicine

## 2015-10-03 DIAGNOSIS — Z1231 Encounter for screening mammogram for malignant neoplasm of breast: Secondary | ICD-10-CM

## 2015-11-04 ENCOUNTER — Ambulatory Visit: Payer: Commercial Managed Care - HMO

## 2015-11-14 ENCOUNTER — Ambulatory Visit
Admission: RE | Admit: 2015-11-14 | Discharge: 2015-11-14 | Disposition: A | Discharge status: Home / Self Care | Payer: PPO | Source: Ambulatory Visit | Attending: Internal Medicine | Admitting: Internal Medicine

## 2015-11-14 DIAGNOSIS — Z1231 Encounter for screening mammogram for malignant neoplasm of breast: Secondary | ICD-10-CM

## 2015-12-17 DIAGNOSIS — H16143 Punctate keratitis, bilateral: Secondary | ICD-10-CM | POA: Diagnosis not present

## 2016-01-21 DIAGNOSIS — I10 Essential (primary) hypertension: Secondary | ICD-10-CM | POA: Diagnosis not present

## 2016-01-21 DIAGNOSIS — R739 Hyperglycemia, unspecified: Secondary | ICD-10-CM | POA: Diagnosis not present

## 2016-01-21 DIAGNOSIS — E039 Hypothyroidism, unspecified: Secondary | ICD-10-CM | POA: Diagnosis not present

## 2016-01-21 DIAGNOSIS — M810 Age-related osteoporosis without current pathological fracture: Secondary | ICD-10-CM | POA: Diagnosis not present

## 2016-01-28 DIAGNOSIS — I10 Essential (primary) hypertension: Secondary | ICD-10-CM | POA: Diagnosis not present

## 2016-01-28 DIAGNOSIS — R739 Hyperglycemia, unspecified: Secondary | ICD-10-CM | POA: Diagnosis not present

## 2016-01-28 DIAGNOSIS — E039 Hypothyroidism, unspecified: Secondary | ICD-10-CM | POA: Diagnosis not present

## 2016-06-23 DIAGNOSIS — L309 Dermatitis, unspecified: Secondary | ICD-10-CM | POA: Diagnosis not present

## 2016-07-21 DIAGNOSIS — I10 Essential (primary) hypertension: Secondary | ICD-10-CM | POA: Diagnosis not present

## 2016-07-21 DIAGNOSIS — E039 Hypothyroidism, unspecified: Secondary | ICD-10-CM | POA: Diagnosis not present

## 2016-07-21 DIAGNOSIS — R739 Hyperglycemia, unspecified: Secondary | ICD-10-CM | POA: Diagnosis not present

## 2016-07-28 DIAGNOSIS — R739 Hyperglycemia, unspecified: Secondary | ICD-10-CM | POA: Diagnosis not present

## 2016-07-28 DIAGNOSIS — E039 Hypothyroidism, unspecified: Secondary | ICD-10-CM | POA: Diagnosis not present

## 2016-07-28 DIAGNOSIS — Z0000 Encounter for general adult medical examination without abnormal findings: Secondary | ICD-10-CM | POA: Diagnosis not present

## 2016-07-28 DIAGNOSIS — I10 Essential (primary) hypertension: Secondary | ICD-10-CM | POA: Diagnosis not present

## 2016-07-28 DIAGNOSIS — Z23 Encounter for immunization: Secondary | ICD-10-CM | POA: Diagnosis not present

## 2016-10-12 ENCOUNTER — Other Ambulatory Visit: Payer: Self-pay | Admitting: Internal Medicine

## 2016-10-12 DIAGNOSIS — Z1231 Encounter for screening mammogram for malignant neoplasm of breast: Secondary | ICD-10-CM

## 2016-11-03 DIAGNOSIS — I10 Essential (primary) hypertension: Secondary | ICD-10-CM | POA: Diagnosis not present

## 2016-11-03 DIAGNOSIS — E039 Hypothyroidism, unspecified: Secondary | ICD-10-CM | POA: Diagnosis not present

## 2016-11-03 DIAGNOSIS — R739 Hyperglycemia, unspecified: Secondary | ICD-10-CM | POA: Diagnosis not present

## 2016-11-08 DIAGNOSIS — E039 Hypothyroidism, unspecified: Secondary | ICD-10-CM | POA: Diagnosis not present

## 2016-11-08 DIAGNOSIS — I10 Essential (primary) hypertension: Secondary | ICD-10-CM | POA: Diagnosis not present

## 2016-11-08 DIAGNOSIS — R739 Hyperglycemia, unspecified: Secondary | ICD-10-CM | POA: Diagnosis not present

## 2016-11-08 DIAGNOSIS — Z23 Encounter for immunization: Secondary | ICD-10-CM | POA: Diagnosis not present

## 2016-11-16 ENCOUNTER — Ambulatory Visit
Admission: RE | Admit: 2016-11-16 | Discharge: 2016-11-16 | Disposition: A | Discharge status: Home / Self Care | Payer: PPO | Source: Ambulatory Visit | Attending: Internal Medicine | Admitting: Internal Medicine

## 2016-11-16 DIAGNOSIS — Z1231 Encounter for screening mammogram for malignant neoplasm of breast: Secondary | ICD-10-CM

## 2017-01-06 DIAGNOSIS — I788 Other diseases of capillaries: Secondary | ICD-10-CM | POA: Diagnosis not present

## 2017-01-06 DIAGNOSIS — L820 Inflamed seborrheic keratosis: Secondary | ICD-10-CM | POA: Diagnosis not present

## 2017-01-18 DIAGNOSIS — M25512 Pain in left shoulder: Secondary | ICD-10-CM | POA: Diagnosis not present

## 2017-01-18 DIAGNOSIS — I10 Essential (primary) hypertension: Secondary | ICD-10-CM | POA: Diagnosis not present

## 2017-01-18 DIAGNOSIS — Z6826 Body mass index (BMI) 26.0-26.9, adult: Secondary | ICD-10-CM | POA: Diagnosis not present

## 2017-01-18 DIAGNOSIS — M545 Low back pain: Secondary | ICD-10-CM | POA: Diagnosis not present

## 2017-02-12 ENCOUNTER — Other Ambulatory Visit: Payer: Self-pay | Admitting: Neurosurgery

## 2017-02-12 DIAGNOSIS — G8929 Other chronic pain: Secondary | ICD-10-CM

## 2017-02-12 DIAGNOSIS — M545 Low back pain: Principal | ICD-10-CM

## 2017-02-28 ENCOUNTER — Ambulatory Visit
Admission: RE | Admit: 2017-02-28 | Discharge: 2017-02-28 | Disposition: A | Discharge status: Home / Self Care | Payer: PPO | Source: Ambulatory Visit | Attending: Neurosurgery | Admitting: Neurosurgery

## 2017-02-28 DIAGNOSIS — M545 Low back pain: Principal | ICD-10-CM

## 2017-02-28 DIAGNOSIS — M48061 Spinal stenosis, lumbar region without neurogenic claudication: Secondary | ICD-10-CM | POA: Diagnosis not present

## 2017-02-28 DIAGNOSIS — G8929 Other chronic pain: Secondary | ICD-10-CM

## 2017-03-22 DIAGNOSIS — I10 Essential (primary) hypertension: Secondary | ICD-10-CM | POA: Diagnosis not present

## 2017-03-22 DIAGNOSIS — M545 Low back pain: Secondary | ICD-10-CM | POA: Diagnosis not present

## 2017-03-22 DIAGNOSIS — Z6825 Body mass index (BMI) 25.0-25.9, adult: Secondary | ICD-10-CM | POA: Diagnosis not present

## 2017-05-09 ENCOUNTER — Encounter (HOSPITAL_COMMUNITY): Payer: Self-pay

## 2017-05-09 ENCOUNTER — Inpatient Hospital Stay (HOSPITAL_COMMUNITY)
Admission: EM | Admit: 2017-05-09 | Discharge: 2017-05-13 | DRG: 372 | Disposition: A | Discharge status: Home / Self Care | Payer: PPO | Attending: Internal Medicine | Admitting: Internal Medicine

## 2017-05-09 ENCOUNTER — Emergency Department (HOSPITAL_COMMUNITY): Payer: PPO

## 2017-05-09 ENCOUNTER — Other Ambulatory Visit: Payer: Self-pay

## 2017-05-09 DIAGNOSIS — F329 Major depressive disorder, single episode, unspecified: Secondary | ICD-10-CM | POA: Diagnosis present

## 2017-05-09 DIAGNOSIS — I951 Orthostatic hypotension: Secondary | ICD-10-CM | POA: Diagnosis not present

## 2017-05-09 DIAGNOSIS — Z79890 Hormone replacement therapy (postmenopausal): Secondary | ICD-10-CM

## 2017-05-09 DIAGNOSIS — Z880 Allergy status to penicillin: Secondary | ICD-10-CM | POA: Diagnosis not present

## 2017-05-09 DIAGNOSIS — N179 Acute kidney failure, unspecified: Secondary | ICD-10-CM | POA: Diagnosis not present

## 2017-05-09 DIAGNOSIS — E118 Type 2 diabetes mellitus with unspecified complications: Secondary | ICD-10-CM | POA: Diagnosis not present

## 2017-05-09 DIAGNOSIS — I10 Essential (primary) hypertension: Secondary | ICD-10-CM | POA: Diagnosis not present

## 2017-05-09 DIAGNOSIS — R1111 Vomiting without nausea: Secondary | ICD-10-CM | POA: Diagnosis not present

## 2017-05-09 DIAGNOSIS — K838 Other specified diseases of biliary tract: Secondary | ICD-10-CM | POA: Diagnosis present

## 2017-05-09 DIAGNOSIS — Z7982 Long term (current) use of aspirin: Secondary | ICD-10-CM | POA: Diagnosis not present

## 2017-05-09 DIAGNOSIS — Z79899 Other long term (current) drug therapy: Secondary | ICD-10-CM

## 2017-05-09 DIAGNOSIS — K760 Fatty (change of) liver, not elsewhere classified: Secondary | ICD-10-CM | POA: Diagnosis present

## 2017-05-09 DIAGNOSIS — A0472 Enterocolitis due to Clostridium difficile, not specified as recurrent: Principal | ICD-10-CM | POA: Diagnosis present

## 2017-05-09 DIAGNOSIS — E785 Hyperlipidemia, unspecified: Secondary | ICD-10-CM | POA: Diagnosis present

## 2017-05-09 DIAGNOSIS — K2970 Gastritis, unspecified, without bleeding: Secondary | ICD-10-CM | POA: Diagnosis not present

## 2017-05-09 DIAGNOSIS — Z7984 Long term (current) use of oral hypoglycemic drugs: Secondary | ICD-10-CM | POA: Diagnosis not present

## 2017-05-09 DIAGNOSIS — R197 Diarrhea, unspecified: Secondary | ICD-10-CM | POA: Diagnosis not present

## 2017-05-09 DIAGNOSIS — Z90710 Acquired absence of both cervix and uterus: Secondary | ICD-10-CM

## 2017-05-09 DIAGNOSIS — E039 Hypothyroidism, unspecified: Secondary | ICD-10-CM | POA: Diagnosis not present

## 2017-05-09 DIAGNOSIS — E119 Type 2 diabetes mellitus without complications: Secondary | ICD-10-CM | POA: Diagnosis present

## 2017-05-09 DIAGNOSIS — Z87891 Personal history of nicotine dependence: Secondary | ICD-10-CM

## 2017-05-09 DIAGNOSIS — R112 Nausea with vomiting, unspecified: Secondary | ICD-10-CM

## 2017-05-09 LAB — C DIFFICILE QUICK SCREEN W PCR REFLEX
C Diff antigen: POSITIVE — AB
C Diff toxin: NEGATIVE

## 2017-05-09 LAB — COMPREHENSIVE METABOLIC PANEL
ALK PHOS: 54 U/L (ref 38–126)
ALT: 26 U/L (ref 14–54)
AST: 30 U/L (ref 15–41)
Albumin: 3.6 g/dL (ref 3.5–5.0)
Anion gap: 10 (ref 5–15)
BILIRUBIN TOTAL: 0.5 mg/dL (ref 0.3–1.2)
BUN: 24 mg/dL — AB (ref 6–20)
CALCIUM: 8.1 mg/dL — AB (ref 8.9–10.3)
CO2: 20 mmol/L — ABNORMAL LOW (ref 22–32)
CREATININE: 1.09 mg/dL — AB (ref 0.44–1.00)
Chloride: 105 mmol/L (ref 101–111)
GFR calc Af Amer: 55 mL/min — ABNORMAL LOW (ref >60)
GFR calc non Af Amer: 47 mL/min — ABNORMAL LOW (ref >60)
Glucose, Bld: 127 mg/dL — ABNORMAL HIGH (ref 65–99)
Potassium: 3.8 mmol/L (ref 3.5–5.1)
Sodium: 135 mmol/L (ref 135–145)
TOTAL PROTEIN: 6.4 g/dL — AB (ref 6.5–8.1)

## 2017-05-09 LAB — CBC
HCT: 40 % (ref 36.0–46.0)
Hemoglobin: 12.9 g/dL (ref 12.0–15.0)
MCH: 29.7 pg (ref 26.0–34.0)
MCHC: 32.3 g/dL (ref 30.0–36.0)
MCV: 92 fL (ref 78.0–100.0)
PLATELETS: 264 10*3/uL (ref 150–400)
RBC: 4.35 MIL/uL (ref 3.87–5.11)
RDW: 14.3 % (ref 11.5–15.5)
WBC: 7.3 10*3/uL (ref 4.0–10.5)

## 2017-05-09 LAB — URINALYSIS, ROUTINE W REFLEX MICROSCOPIC
BILIRUBIN URINE: NEGATIVE
Glucose, UA: NEGATIVE mg/dL
Ketones, ur: NEGATIVE mg/dL
NITRITE: NEGATIVE
PH: 5 (ref 5.0–8.0)
Protein, ur: NEGATIVE mg/dL
SPECIFIC GRAVITY, URINE: 1.031 — AB (ref 1.005–1.030)

## 2017-05-09 LAB — CBG MONITORING, ED
Glucose-Capillary: 121 mg/dL — ABNORMAL HIGH (ref 65–99)
Glucose-Capillary: 91 mg/dL (ref 65–99)

## 2017-05-09 LAB — LIPASE, BLOOD: Lipase: 25 U/L (ref 11–51)

## 2017-05-09 LAB — CLOSTRIDIUM DIFFICILE BY PCR, REFLEXED: CDIFFPCR: POSITIVE — AB

## 2017-05-09 MED ORDER — SODIUM CHLORIDE 0.9% FLUSH
3.0000 mL | Freq: Two times a day (BID) | INTRAVENOUS | Status: DC
  Administered 2017-05-09 – 2017-05-12 (×5): 3 mL via INTRAVENOUS

## 2017-05-09 MED ORDER — SODIUM CHLORIDE 0.9 % IV SOLN
INTRAVENOUS | Status: DC
  Administered 2017-05-09 – 2017-05-10 (×2): via INTRAVENOUS

## 2017-05-09 MED ORDER — IOPAMIDOL (ISOVUE-300) INJECTION 61%
INTRAVENOUS | Status: AC
  Administered 2017-05-09: 100 mL
  Filled 2017-05-09: qty 100

## 2017-05-09 MED ORDER — ASPIRIN 81 MG PO CHEW
81.0000 mg | CHEWABLE_TABLET | Freq: Every day | ORAL | Status: DC
  Administered 2017-05-10 – 2017-05-13 (×4): 81 mg via ORAL
  Filled 2017-05-09 (×4): qty 1

## 2017-05-09 MED ORDER — DICYCLOMINE HCL 10 MG/ML IM SOLN
20.0000 mg | Freq: Once | INTRAMUSCULAR | Status: AC
  Administered 2017-05-09: 20 mg via INTRAMUSCULAR
  Filled 2017-05-09: qty 2

## 2017-05-09 MED ORDER — ONDANSETRON HCL 4 MG PO TABS: 4.0000 mg | ORAL_TABLET | Freq: Four times a day (QID) | ORAL | Status: DC | PRN

## 2017-05-09 MED ORDER — METRONIDAZOLE IN NACL 5-0.79 MG/ML-% IV SOLN: 500.0000 mg | Freq: Three times a day (TID) | INTRAVENOUS | Status: DC

## 2017-05-09 MED ORDER — PRAVASTATIN SODIUM 40 MG PO TABS
40.0000 mg | ORAL_TABLET | Freq: Every day | ORAL | Status: DC
  Administered 2017-05-09 – 2017-05-12 (×4): 40 mg via ORAL
  Filled 2017-05-09 (×4): qty 1

## 2017-05-09 MED ORDER — IPRATROPIUM-ALBUTEROL 0.5-2.5 (3) MG/3ML IN SOLN: 3.0000 mL | RESPIRATORY_TRACT | Status: DC | PRN

## 2017-05-09 MED ORDER — METRONIDAZOLE 500 MG PO TABS
500.0000 mg | ORAL_TABLET | Freq: Once | ORAL | Status: AC
Start: 2017-05-09 — End: 2017-05-09
  Administered 2017-05-09: 500 mg via ORAL
  Filled 2017-05-09: qty 1

## 2017-05-09 MED ORDER — ONDANSETRON 4 MG PO TBDP
8.0000 mg | ORAL_TABLET | Freq: Once | ORAL | Status: AC
  Administered 2017-05-09: 8 mg via ORAL
  Filled 2017-05-09: qty 2

## 2017-05-09 MED ORDER — PROMETHAZINE HCL 25 MG/ML IJ SOLN
12.5000 mg | Freq: Once | INTRAMUSCULAR | Status: AC
  Administered 2017-05-09: 12.5 mg via INTRAVENOUS
  Filled 2017-05-09: qty 1

## 2017-05-09 MED ORDER — ONDANSETRON HCL 4 MG/2ML IJ SOLN
4.0000 mg | Freq: Four times a day (QID) | INTRAMUSCULAR | Status: DC | PRN
  Administered 2017-05-11: 4 mg via INTRAVENOUS
  Filled 2017-05-09: qty 2

## 2017-05-09 MED ORDER — INSULIN ASPART 100 UNIT/ML ~~LOC~~ SOLN: 0.0000 [IU] | Freq: Every day | SUBCUTANEOUS | Status: DC

## 2017-05-09 MED ORDER — INSULIN ASPART 100 UNIT/ML ~~LOC~~ SOLN: 0.0000 [IU] | Freq: Three times a day (TID) | SUBCUTANEOUS | Status: DC

## 2017-05-09 MED ORDER — ENOXAPARIN SODIUM 40 MG/0.4ML ~~LOC~~ SOLN
40.0000 mg | SUBCUTANEOUS | Status: DC
  Administered 2017-05-10 – 2017-05-12 (×3): 40 mg via SUBCUTANEOUS
  Filled 2017-05-09 (×5): qty 0.4

## 2017-05-09 MED ORDER — ACETAMINOPHEN 650 MG RE SUPP: 650.0000 mg | Freq: Four times a day (QID) | RECTAL | Status: DC | PRN

## 2017-05-09 MED ORDER — LOPERAMIDE HCL 2 MG PO CAPS
4.0000 mg | ORAL_CAPSULE | Freq: Once | ORAL | Status: DC
  Filled 2017-05-09: qty 2

## 2017-05-09 MED ORDER — SODIUM CHLORIDE 0.9 % IV BOLUS (SEPSIS)
1000.0000 mL | Freq: Once | INTRAVENOUS | Status: AC
  Administered 2017-05-09: 1000 mL via INTRAVENOUS

## 2017-05-09 MED ORDER — ONDANSETRON HCL 4 MG/2ML IJ SOLN
4.0000 mg | Freq: Once | INTRAMUSCULAR | Status: DC
  Filled 2017-05-09: qty 2

## 2017-05-09 MED ORDER — LEVOTHYROXINE SODIUM 50 MCG PO TABS
50.0000 ug | ORAL_TABLET | Freq: Every day | ORAL | Status: DC
  Administered 2017-05-10 – 2017-05-13 (×4): 50 ug via ORAL
  Filled 2017-05-09 (×5): qty 1

## 2017-05-09 MED ORDER — ACETAMINOPHEN 325 MG PO TABS: 650.0000 mg | ORAL_TABLET | Freq: Four times a day (QID) | ORAL | Status: DC | PRN

## 2017-05-09 MED ORDER — VANCOMYCIN 50 MG/ML ORAL SOLUTION
125.0000 mg | Freq: Four times a day (QID) | ORAL | Status: DC
  Administered 2017-05-10 – 2017-05-13 (×13): 125 mg via ORAL
  Filled 2017-05-09 (×16): qty 2.5

## 2017-05-09 NOTE — ED Provider Notes (Signed)
77 year old female presents with hypotension, N/V/D. She states the symptoms started 5 days ago. She has had multiple episodes of diarrhea to the point where its just yellow bile. She's had at least 10 episodes since being in the ED. She denies recent hospitalization or antibiotic use. Labs are overall reassuring. CT of abdomen/pelvis was normal. UA showed high specific gravity but did not look infected. C-diff test was ordered by the previous provider. Initially the C Diff antigen came back positive and toxin was negative. PCR results revealed that she did test positive for toxigenic C-diff. Discussed with the patient on admission vs outpatient treatment. She states that she still feels terrible and lightheaded. She has continued to have multiple episodes of diarrhea. Orthostatics BP did not drop however her HR did elevate >20 BPM with standing. She was given a dose of Flagyl. Discussed with Dr. Anitra LauthPlunkett. Will admit for further management. Spoke with Arlyss Queen Smith who will see the patient.   Angel Day, Angel Felber Marie, PA-C 05/09/17 2231    [name], [name], MD 05/10/17 [phone]

## 2017-05-09 NOTE — ED Notes (Signed)
Bedpan removed.  Scant amount of diarrhea noted. Pt c/o nausea.

## 2017-05-09 NOTE — ED Notes (Signed)
Placed pt on bedpan, tolerated well. 

## 2017-05-09 NOTE — ED Triage Notes (Signed)
[facility]- pt coming from home with nausea and diarrhea. Pt reports constant diarrhea since Wednesday. Pt felt weak this morning with standing. Was initially hypotensive with EMS of 80 systolic. No fevers at home. Pt had approximately 700cc of fluid. Last BP was 125/57, HR 66, SPO2% 97.

## 2017-05-09 NOTE — ED Notes (Signed)
Pt states "I feel really bad-- not any better than when I came."

## 2017-05-09 NOTE — ED Notes (Signed)
Pt able to use bedpan, tolerated well. 

## 2017-05-09 NOTE — ED Notes (Signed)
Pt had yellow liquid stool-- enteric precautions started

## 2017-05-09 NOTE — ED Provider Notes (Signed)
[facility] EMERGENCY DEPARTMENT Provider Note   CSN: [id number] Arrival date & time: 05/09/17  1116     History   Chief Complaint No chief complaint on file.   HPI Angel Day is a 77 y.o. female who presents emergency department chief complaint of nausea vomiting diarrhea.  Patient had onset of symptoms 4 days ago.  She states that her diarrhea has been the predominant symptom.  She has frequent explosive yellow watery stools.  She has not had any foreign travel, ingestion of suspicious foods, contacts with similar symptoms, recent antibiotic use.  Patient complains of abdominal cramping.  She has had a few episodes of nonbloody nonbilious vomitus.  She states she has never had anything like this before.  Patient called the ambulance today because she was feeling extremely weak and was not getting better.  She denies urinary symptoms.  She has had chills but denies.  Patient did have a fever at home on Wednesday at the onset of symptoms which resolved.  She feels extremely fatigued and has anorexia.  HPI  Past Medical History:  Diagnosis Date  . Depression   . Diabetes (HCC)   . High cholesterol   . Hypertension     Patient Active Problem List   Diagnosis Date Noted  . Facial twitching 03/31/2015  . Tinnitus 03/31/2015  . HTN (hypertension) 03/31/2015  . Impaired glucose tolerance 03/31/2015    Past Surgical History:  Procedure Laterality Date  . BREAST REDUCTION SURGERY  1980  . REDUCTION MAMMAPLASTY Bilateral   . REFRACTIVE SURGERY  1991  . VAGINAL HYSTERECTOMY  1992    OB History    No data available       Home Medications    Prior to Admission medications   Medication Sig Start Date End Date Taking? Authorizing Provider  aspirin 81 MG tablet Take 81 mg by mouth daily.    [provider]  CALCIUM PO Take 600 mg by mouth daily.    [provider]  Cholecalciferol (VITAMIN D3) 5000 units CAPS Take 1 capsule by mouth  daily.    [provider]  FLUoxetine (PROZAC) 10 MG capsule Take 10 mg by mouth every morning. 02/08/15   [provider]  lisinopril (PRINIVIL,ZESTRIL) 10 MG tablet Take 10 mg by mouth daily. 12/02/14   [provider]  losartan (COZAAR) 50 MG tablet Take 100 mg by mouth daily.  01/20/15   [provider]  metFORMIN (GLUCOPHAGE) 500 MG tablet Take 500 mg by mouth daily. 02/08/15   [provider]  OMEPRAZOLE PO Take 1 tablet by mouth as needed.    [provider]  pravastatin (PRAVACHOL) 40 MG tablet Take 40 mg by mouth at bedtime. 02/08/15   [provider]    Family History Family History  Problem Relation Age of Onset  . Breast cancer Mother   . Cancer Mother        sarcoma    Social History Social History   Tobacco Use  . Smoking status: Former Smoker    Last attempt to quit: 03/02/1975    Years since quitting: 42.2  . Smokeless tobacco: Never Used  Substance Use Topics  . Alcohol use: No    Alcohol/week: 0.0 oz  . Drug use: No     Allergies   Penicillins   Review of Systems Review of Systems  Ten systems reviewed and are negative for acute change, except as noted in the HPI.   Physical Exam  Updated Vital Signs BP (!) 109/58   Pulse 65   Temp 97.8 F (36.6 C) (Oral)   Resp 15   SpO2 97%   Physical Exam  Constitutional: She is oriented to person, place, and time. She appears well-developed and well-nourished. No distress.  HENT:  Head: Normocephalic and atraumatic.  Dry oral mucosa  Eyes: Conjunctivae and EOM are normal. Pupils are equal, round, and reactive to light. No scleral icterus.  Eyes appear sunken  Neck: Normal range of motion.  Cardiovascular: Normal rate, regular rhythm and normal heart sounds. Exam reveals no gallop and no friction rub.  No murmur heard. Pulmonary/Chest: Effort normal and breath sounds normal. No respiratory distress.  Abdominal: Soft. Bowel sounds are normal.  She exhibits no distension and no mass. There is tenderness. There is no guarding.  Tenderness in the left lower abdominal quadrant  Neurological: She is alert and oriented to person, place, and time.  Skin: Skin is warm and dry. She is not diaphoretic.  Psychiatric: Her behavior is normal.  Nursing note and vitals reviewed.    ED Treatments / Results  Labs (all labs ordered are listed, but only abnormal results are displayed) Labs Reviewed  COMPREHENSIVE METABOLIC PANEL - Abnormal; Notable for the following components:      Result Value   CO2 20 (*)    Glucose, Bld 127 (*)    BUN 24 (*)    Creatinine, Ser 1.09 (*)    Calcium 8.1 (*)    Total Protein 6.4 (*)    GFR calc non Af Amer 47 (*)    GFR calc Af Amer 55 (*)    All other components within normal limits  CBG MONITORING, ED - Abnormal; Notable for the following components:   Glucose-Capillary 121 (*)    All other components within normal limits  LIPASE, BLOOD  CBC  URINALYSIS, ROUTINE W REFLEX MICROSCOPIC    EKG  EKG Interpretation None       Radiology Ct Abdomen Pelvis W Contrast  Result Date: 05/09/2017 CLINICAL DATA:  Acute presentation with nausea and diarrhea over the last 2 days. Weakness. EXAM: CT ABDOMEN AND PELVIS WITH CONTRAST TECHNIQUE: Multidetector CT imaging of the abdomen and pelvis was performed using the standard protocol following bolus administration of intravenous contrast. CONTRAST:  100mL ISOVUE-300 IOPAMIDOL (ISOVUE-300) INJECTION 61% COMPARISON:  None. FINDINGS: Lower chest: Normal except for small hiatal hernia. Hepatobiliary: Pronounced diffuse fatty change of the liver. Distended gallbladder, but without calcified stones. No wall thickening or surrounding edema. No ductal dilatation. Pancreas: Normal Spleen: Normal Adrenals/Urinary Tract: Adrenal glands are normal. Kidneys are normal. No hydronephrosis. Bladder is normal. Stomach/Bowel: Appendix is normal. No small bowel abnormality. There is  diverticulosis of the sigmoid colon but no evidence of diverticulitis. Vascular/Lymphatic: Aortic atherosclerosis. No aneurysm. No adenopathy. Reproductive: Previous hysterectomy.  No pelvic mass. Other: No free fluid or air. Musculoskeletal: Lower lumbar degenerative changes. IMPRESSION: Pronounced hepatic steatosis. Distended gallbladder but without evidence of stones or inflammation by CT. Small hiatal hernia. Aortic atherosclerosis. Diverticulosis without evidence of diverticulitis. Lumbar degenerative changes. Electronically Signed   By: Paulina FusiMark  Shogry M.D.   On: 05/09/2017 14:02    Procedures Procedures (including critical care time)  Medications Ordered in ED Medications  sodium chloride 0.9 % bolus 1,000 mL (1,000 mLs Intravenous New Bag/Given 05/09/17 1308)  promethazine (PHENERGAN) injection 12.5 mg (12.5 mg Intravenous Given 05/09/17 1308)  iopamidol (ISOVUE-300) 61 % injection (100 mLs  Contrast Given 05/09/17 1332)  Initial Impression / Assessment and Plan / ED Course  I have reviewed the triage vital signs and the nursing notes.  Pertinent labs & imaging results that were available during my care of the patient were reviewed by me and considered in my medical decision making (see chart for details).     Patient with nausea vomiting and diarrhea.  She continues to have persistent nausea despite Phenergan.  I have ordered Zofran, Bentyl IM, and Imodium as the patient has had one more episode of diarrhea and complains of abdominal cramping.  She has been able to hold down fluids.  CT scan reviewed without any abnormalities.  I have ordered C. difficile and stool cultures.  The patient continues to feel very poorly.  Her IV did infiltrate.  For the orthostatic vital signs on the patient and we will reestablish with alliance she need more fluids.  She is tolerating p.o.'s at this time.  Given sign out to PA Jefm Bryant will assume care of the patient.  I expect discharge.  EKG reviewed without  signs of QT prolongation at baseline  Final Clinical Impressions(s) / ED Diagnoses   Final diagnoses:  None    ED Discharge Orders    None       Arthor Captain, PA-C 05/09/17 1735    Tegeler, Canary Brim, MD 05/10/17 1050

## 2017-05-09 NOTE — ED Notes (Signed)
CBG 91 

## 2017-05-09 NOTE — ED Notes (Signed)
Smith, MD at bedside.  

## 2017-05-09 NOTE — ED Notes (Signed)
Ice water given

## 2017-05-09 NOTE — ED Notes (Signed)
Pt unable to void at this time. Nurse notified.

## 2017-05-09 NOTE — H&P (Addendum)
History and Physical    Angel Day:[id number] DOB: 21-Jul-1940 DOA: 05/09/2017  Referring MD/NP/PA: Terance Hart, PA-C PCP: Pearson Grippe, MD  Patient coming from: Home  Chief Complaint: Diarrhea  I have personally briefly reviewed patient's old medical records in [facility]   HPI: Angel Day is a 77 y.o. female with medical history significant of HTN, HLD, DM type II, and hypothyroidism; who presents with complaints of 5 days of diarrhea.  She reports having multiple episodes of nonbloody liquid diarrhea since that time with complaints of intermittent crampy abdominal pain.  Associated symptoms include complaints of generalized malaise/weakness, lightheadedness, nausea, and vomiting unable to tolerate any significant amount of fluid over the last several days.  She does visit her mother in a nursing facility quite often and notes that they had had a outbreak of Arna Medici virus approximately 2 weeks ago.  Denies any recent antibiotic use, fever, chills, loss of consciousness, chest pain, shortness of breath, or blood in emesis/stool/urine.  Patient reports that her blood sugars had been well controlled on metformin which she is been taking for years until she got sick here recently.  Her last hemoglobin A1c was good, but she does not recall the exact number.  ED Course: Upon admission into the emergency department patient was seen to be afebrile with vital signs relatively within normal limits.  Labs revealed normal CBC, BUN 24, creatinine 1.09, and C. Difficile antigen and PCR positive.  CT of the abdomen and pelvis was negative for any signs of colitis, but revealed signs of pronounced hepatic steatosis.  Patient has received 1 L of normal saline IV fluids and orthostatic vital signs obtained 5 hours later were noted to be positive.  She also received antiemetics, and was subsequently given metronidazole p.o.  TRH to called to admit.  Review of Systems  Constitutional: Positive for  malaise/fatigue. Negative for chills and fever.  HENT: Negative for ear discharge and nosebleeds.   Eyes: Negative for photophobia and pain.  Respiratory: Negative for cough, shortness of breath and wheezing.   Cardiovascular: Negative for chest pain and leg swelling.  Gastrointestinal: Positive for abdominal pain, diarrhea, nausea and vomiting. Negative for blood in stool.  Genitourinary: Negative for dysuria and frequency.  Musculoskeletal: Negative for falls and myalgias.  Skin: Negative for itching and rash.  Neurological: Positive for dizziness and weakness. Negative for focal weakness and loss of consciousness.  Endo/Heme/Allergies: Negative for polydipsia.  Psychiatric/Behavioral: Negative for memory loss and substance abuse.    Past Medical History:  Diagnosis Date  . Depression   . Diabetes (HCC)   . High cholesterol   . Hypertension     Past Surgical History:  Procedure Laterality Date  . BREAST REDUCTION SURGERY  1980  . REDUCTION MAMMAPLASTY Bilateral   . REFRACTIVE SURGERY  1991  . VAGINAL HYSTERECTOMY  1992     reports that she quit smoking about 42 years ago. she has never used smokeless tobacco. She reports that she does not drink alcohol or use drugs.  Allergies  Allergen Reactions  . Penicillins Anaphylaxis and Swelling    Has patient had a PCN reaction causing immediate rash, facial/tongue/throat swelling, SOB or lightheadedness with hypotension: Yes Has patient had a PCN reaction causing severe rash involving mucus membranes or skin necrosis: Yes Has patient had a PCN reaction that required hospitalization: No Has patient had a PCN reaction occurring within the last 10 years: No If all of the above answers are "NO", then may  proceed with Cephalosporin use.     Family History  Problem Relation Age of Onset  . Breast cancer Mother   . Cancer Mother        sarcoma    Prior to Admission medications   Medication Sig Start Date End Date Taking?  Authorizing Provider  aspirin 81 MG tablet Take 81 mg by mouth daily.   Yes [provider]  CALCIUM PO Take 600 mg by mouth daily.   Yes [provider]  Cholecalciferol (VITAMIN D3) 5000 units CAPS Take 5,000 Units by mouth 2 (two) times a week.    Yes [provider]  levothyroxine (SYNTHROID, LEVOTHROID) 50 MCG tablet Take 50 mcg by mouth daily. 03/02/17  Yes [provider]  losartan (COZAAR) 100 MG tablet Take 100 mg by mouth daily. 04/13/17  Yes [provider]  metFORMIN (GLUCOPHAGE) 500 MG tablet Take 500 mg by mouth 2 (two) times daily with a meal.  02/08/15  Yes [provider]  pravastatin (PRAVACHOL) 40 MG tablet Take 40 mg by mouth at bedtime. 02/08/15  Yes [provider]    Physical Exam:  Constitutional: Elderly female who appears sick, but in no acute distress Vitals:   05/09/17 1830 05/09/17 2015 05/09/17 2045 05/09/17 2115  BP: (!) 108/47 113/60 118/60 102/82  Pulse: 64 65 70 79  Resp: 14 13 17 14   Temp:      TempSrc:      SpO2: 94% 97% 96% 94%   Eyes: PERRL, lids and conjunctivae normal ENMT: Mucous membranes are dry. Posterior pharynx clear of any exudate or lesions.  Neck: normal, supple, no masses, no thyromegaly Respiratory: clear to auscultation bilaterally, no wheezing, no crackles. Normal respiratory effort. No accessory muscle use.  Cardiovascular: Regular rate and rhythm, no murmurs / rubs / gallops. No extremity edema. 2+ pedal pulses. No carotid bruits.  Abdomen: no tenderness, no masses palpated. No hepatosplenomegaly. Bowel sounds positive.  Musculoskeletal: no clubbing / cyanosis. No joint deformity upper and lower extremities. Good ROM, no contractures. Normal muscle tone.  Skin: no rashes, lesions, ulcers. No induration Neurologic: CN 2-12 grossly intact. Sensation intact, DTR normal. Strength 5/5 in all 4.  Psychiatric: Normal judgment and insight. Alert and oriented x 3. Normal mood.      Labs on Admission: I have personally reviewed following labs and imaging studies  CBC: Recent Labs  Lab 05/09/17 1146  WBC 7.3  HGB 12.9  HCT 40.0  MCV 92.0  PLT 264   Basic Metabolic Panel: Recent Labs  Lab 05/09/17 1146  NA 135  K 3.8  CL 105  CO2 20*  GLUCOSE 127*  BUN 24*  CREATININE 1.09*  CALCIUM 8.1*   GFR: CrCl cannot be calculated (Unknown ideal weight.). Liver Function Tests: Recent Labs  Lab 05/09/17 1146  AST 30  ALT 26  ALKPHOS 54  BILITOT 0.5  PROT 6.4*  ALBUMIN 3.6   Recent Labs  Lab 05/09/17 1146  LIPASE 25   No results for input(s): AMMONIA in the last 168 hours. Coagulation Profile: No results for input(s): INR, PROTIME in the last 168 hours. Cardiac Enzymes: No results for input(s): CKTOTAL, CKMB, CKMBINDEX, TROPONINI in the last 168 hours. BNP (last 3 results) No results for input(s): PROBNP in the last 8760 hours. HbA1C: No results for input(s): HGBA1C in the last 72 hours. CBG: Recent Labs  Lab 05/09/17 1145  GLUCAP 121*   Lipid Profile: No results for input(s): CHOL, HDL, LDLCALC, TRIG, CHOLHDL, LDLDIRECT  in the last 72 hours. Thyroid Function Tests: No results for input(s): TSH, T4TOTAL, FREET4, T3FREE, THYROIDAB in the last 72 hours. Anemia Panel: No results for input(s): VITAMINB12, FOLATE, FERRITIN, TIBC, IRON, RETICCTPCT in the last 72 hours. Urine analysis:    Component Value Date/Time   COLORURINE YELLOW 05/09/2017 1121   APPEARANCEUR CLEAR 05/09/2017 1121   LABSPEC 1.031 (H) 05/09/2017 1121   PHURINE 5.0 05/09/2017 1121   GLUCOSEU NEGATIVE 05/09/2017 1121   HGBUR SMALL (A) 05/09/2017 1121   BILIRUBINUR NEGATIVE 05/09/2017 1121   KETONESUR NEGATIVE 05/09/2017 1121   PROTEINUR NEGATIVE 05/09/2017 1121   UROBILINOGEN 0.2 07/26/2010 1249   NITRITE NEGATIVE 05/09/2017 1121   LEUKOCYTESUR MODERATE (A) 05/09/2017 1121   Sepsis Labs: Recent Results (from the past 240 hour(s))  C difficile quick scan w PCR  reflex     Status: Abnormal   Collection Time: 05/09/17  5:31 PM  Result Value Ref Range Status   C Diff antigen POSITIVE (A) NEGATIVE Final   C Diff toxin NEGATIVE NEGATIVE Final   C Diff interpretation Results are indeterminate. See PCR results.  Final    Comment: Performed at [facility], [address] [place], [place] [address]  C. Diff by PCR, Reflexed     Status: Abnormal   Collection Time: 05/09/17  5:31 PM  Result Value Ref Range Status   Toxigenic C. Difficile by PCR POSITIVE (A) NEGATIVE Final    Comment: Positive for toxigenic C. difficile with little to no toxin production. Only treat if clinical presentation suggests symptomatic illness. Performed at [facility], [address] [place], [place] [address]      Radiological Exams on Admission: Ct Abdomen Pelvis W Contrast  Result Date: 05/09/2017 CLINICAL DATA:  Acute presentation with nausea and diarrhea over the last 2 days. Weakness. EXAM: CT ABDOMEN AND PELVIS WITH CONTRAST TECHNIQUE: Multidetector CT imaging of the abdomen and pelvis was performed using the standard protocol following bolus administration of intravenous contrast. CONTRAST:  100mL ISOVUE-300 IOPAMIDOL (ISOVUE-300) INJECTION 61% COMPARISON:  None. FINDINGS: Lower chest: Normal except for small hiatal hernia. Hepatobiliary: Pronounced diffuse fatty change of the liver. Distended gallbladder, but without calcified stones. No wall thickening or surrounding edema. No ductal dilatation. Pancreas: Normal Spleen: Normal Adrenals/Urinary Tract: Adrenal glands are normal. Kidneys are normal. No hydronephrosis. Bladder is normal. Stomach/Bowel: Appendix is normal. No small bowel abnormality. There is diverticulosis of the sigmoid colon but no evidence of diverticulitis. Vascular/Lymphatic: Aortic atherosclerosis. No aneurysm. No adenopathy. Reproductive: Previous hysterectomy.  No pelvic mass. Other: No free fluid or air. Musculoskeletal: Lower lumbar  degenerative changes. IMPRESSION: Pronounced hepatic steatosis. Distended gallbladder but without evidence of stones or inflammation by CT. Small hiatal hernia. Aortic atherosclerosis. Diverticulosis without evidence of diverticulitis. Lumbar degenerative changes. Electronically Signed   By: Paulina FusiMark  Shogry M.D.   On: 05/09/2017 14:02      Assessment/Plan Clostridium difficile diarrhea: Acute.  Patient presents with reports of frequent diarrhea.  Patient was C. difficile positive by PCR.  CT scan of the abdomen showed no acute signs of colitis.  Question of possibility of other GI other causing patient's symptoms.  Treated with p.o. metronidazole initially. - Admit to MedSurg bed - Monitor intake and output - Add on GI panel - Changed to oral vancomycin per new recommendations  Nausea and vomiting: Acute.  Reports having nausea and vomiting only taking in small amounts of food. - Continue antiemetics prn  Acute kidney injury: Creatinine noted to be  1.09 with BUN 24.  Elevated BUN to creatinine ratio with history of diarrhea to correlate with prerenal cause of symptoms -Normal saline IV fluids 1 L bolus then placed at 100 ml/hr overnight - Recheck kidney function in a.m.  Orthostatic hypotension, H/O of Essential Hypotension: Acute.  Patient noted to have low blood pressures initially with EMS and positive orthostatic vital signs on admission.  Suspect secondary to hypovolemia related to diarrhea. - Check orthostatic vital signs in a.m. - Held losartan, restart when medically appropriate  Diabetes mellitus type 2: Patient normally well controlled on oral medications of metformin which she is been on many years. - Hypoglycemic protocols - Hold metformin  - CBGs q. before meals and at bedtime with sensitive SSI  Hypothyroidism - Continue levothyroxine   Dilated common bile duct with hepatic steatosis: patient reported having history of common bile stone seen on MRI of lumbar spine 2 months  ago.  CT scan showing hepatic steatosis with dilated common bile duct. - Will likely need outpatient follow-up  Abnormal UA: Suspect could likely be contaminant - add on urine culture - Consider need of treatment  DVT prophylaxis: lovenox Code Status: full  Family Communication: Discussed plan of care with the patient and friend present at bedside Disposition Plan: TBD  Consults called: none  Admission status: observation  Clydie Braun MD [facility] Pager [phone]   If 7PM-7AM, please contact night-coverage [email or web address] Password [facility]  05/09/2017, 10:18 PM

## 2017-05-10 DIAGNOSIS — Z79890 Hormone replacement therapy (postmenopausal): Secondary | ICD-10-CM | POA: Diagnosis not present

## 2017-05-10 DIAGNOSIS — N179 Acute kidney failure, unspecified: Secondary | ICD-10-CM | POA: Diagnosis present

## 2017-05-10 DIAGNOSIS — E039 Hypothyroidism, unspecified: Secondary | ICD-10-CM | POA: Diagnosis present

## 2017-05-10 DIAGNOSIS — Z79899 Other long term (current) drug therapy: Secondary | ICD-10-CM | POA: Diagnosis not present

## 2017-05-10 DIAGNOSIS — I951 Orthostatic hypotension: Secondary | ICD-10-CM | POA: Diagnosis present

## 2017-05-10 DIAGNOSIS — Z7984 Long term (current) use of oral hypoglycemic drugs: Secondary | ICD-10-CM | POA: Diagnosis not present

## 2017-05-10 DIAGNOSIS — Z87891 Personal history of nicotine dependence: Secondary | ICD-10-CM | POA: Diagnosis not present

## 2017-05-10 DIAGNOSIS — Z880 Allergy status to penicillin: Secondary | ICD-10-CM | POA: Diagnosis not present

## 2017-05-10 DIAGNOSIS — F329 Major depressive disorder, single episode, unspecified: Secondary | ICD-10-CM | POA: Diagnosis present

## 2017-05-10 DIAGNOSIS — Z7982 Long term (current) use of aspirin: Secondary | ICD-10-CM | POA: Diagnosis not present

## 2017-05-10 DIAGNOSIS — Z90710 Acquired absence of both cervix and uterus: Secondary | ICD-10-CM | POA: Diagnosis not present

## 2017-05-10 DIAGNOSIS — K838 Other specified diseases of biliary tract: Secondary | ICD-10-CM | POA: Diagnosis present

## 2017-05-10 DIAGNOSIS — K760 Fatty (change of) liver, not elsewhere classified: Secondary | ICD-10-CM | POA: Diagnosis present

## 2017-05-10 DIAGNOSIS — E785 Hyperlipidemia, unspecified: Secondary | ICD-10-CM | POA: Diagnosis present

## 2017-05-10 DIAGNOSIS — A0472 Enterocolitis due to Clostridium difficile, not specified as recurrent: Secondary | ICD-10-CM | POA: Diagnosis present

## 2017-05-10 DIAGNOSIS — I10 Essential (primary) hypertension: Secondary | ICD-10-CM | POA: Diagnosis present

## 2017-05-10 DIAGNOSIS — E119 Type 2 diabetes mellitus without complications: Secondary | ICD-10-CM | POA: Diagnosis present

## 2017-05-10 LAB — BASIC METABOLIC PANEL
Anion gap: 9 (ref 5–15)
BUN: 19 mg/dL (ref 6–20)
CALCIUM: 7.8 mg/dL — AB (ref 8.9–10.3)
CO2: 19 mmol/L — ABNORMAL LOW (ref 22–32)
Chloride: 110 mmol/L (ref 101–111)
Creatinine, Ser: 0.97 mg/dL (ref 0.44–1.00)
GFR, EST NON AFRICAN AMERICAN: 55 mL/min — AB (ref >60)
Glucose, Bld: 103 mg/dL — ABNORMAL HIGH (ref 65–99)
Potassium: 3.6 mmol/L (ref 3.5–5.1)
SODIUM: 138 mmol/L (ref 135–145)

## 2017-05-10 LAB — CBC
HCT: 35.5 % — ABNORMAL LOW (ref 36.0–46.0)
Hemoglobin: 11.5 g/dL — ABNORMAL LOW (ref 12.0–15.0)
MCH: 29.6 pg (ref 26.0–34.0)
MCHC: 32.4 g/dL (ref 30.0–36.0)
MCV: 91.5 fL (ref 78.0–100.0)
PLATELETS: 240 10*3/uL (ref 150–400)
RBC: 3.88 MIL/uL (ref 3.87–5.11)
RDW: 14.3 % (ref 11.5–15.5)
WBC: 7.4 10*3/uL (ref 4.0–10.5)

## 2017-05-10 LAB — GLUCOSE, CAPILLARY
Glucose-Capillary: 113 mg/dL — ABNORMAL HIGH (ref 65–99)
Glucose-Capillary: 100 mg/dL — ABNORMAL HIGH (ref 65–99)
Glucose-Capillary: 101 mg/dL — ABNORMAL HIGH (ref 65–99)
Glucose-Capillary: 112 mg/dL — ABNORMAL HIGH (ref 65–99)

## 2017-05-10 LAB — CBG MONITORING, ED: GLUCOSE-CAPILLARY: 95 mg/dL (ref 65–99)

## 2017-05-10 LAB — MAGNESIUM: MAGNESIUM: 1.9 mg/dL (ref 1.7–2.4)

## 2017-05-10 MED ORDER — ALUM & MAG HYDROXIDE-SIMETH 200-200-20 MG/5ML PO SUSP
30.0000 mL | ORAL | Status: DC | PRN
  Administered 2017-05-10 – 2017-05-13 (×4): 30 mL via ORAL
  Filled 2017-05-10 (×4): qty 30

## 2017-05-10 NOTE — Progress Notes (Signed)
PROGRESS NOTE    Angel Day  ONG:[id number] DOB: 07-24-1940 DOA: 05/09/2017 PCP: Pearson Grippe, MD   Outpatient Specialists:    Brief Narrative:  Angel Day is a 77 y.o. female with medical history significant of HTN, HLD, DM type II, and hypothyroidism; who presents with complaints of 5 days of diarrhea.  She reports having multiple episodes of nonbloody liquid diarrhea since that time with complaints of intermittent crampy abdominal pain. Found to be c diff positive    Assessment & Plan:   Active Problems:   C. difficile diarrhea   Clostridium difficile diarrhea: Acute.  -PO vanc x 10 days QID -plan for d/c home once tolerating PO fluids and stools more solidified   Nausea and vomiting: Acute.   -improving-- still not eating well  Acute kidney injury: Creatinine noted to be 1.09 with BUN 24.  Elevated BUN to creatinine ratio with history of diarrhea to correlate with prerenal cause of symptoms -improved with IVF  Orthostatic hypotension, H/O of Essential Hypotension: Acute.  Patient noted to have low blood pressures initially with EMS and positive orthostatic vital signs on admission.  Suspect secondary to hypovolemia related to diarrhea. -IVF  Diabetes mellitus type 2: Patient normally well controlled on oral medications of metformin which she is been on many years. - Hypoglycemic protocols - Hold metformin   Hypothyroidism - Continue levothyroxine   Dilated common bile duct with hepatic steatosis: patient reported having history of common bile stone seen on MRI of lumbar spine 2 months ago.  CT scan showing hepatic steatosis with dilated common bile duct. - outpatient follow-up  Abnormal UA: Suspect could likely be contaminant -no symptoms -improved with PO vanc     DVT prophylaxis:  Lovenox   Code Status: Full Code   Family Communication:   Disposition Plan:     Consultants:         Subjective: Still not eating well, lots  of stool- bile appearing  Objective: Vitals:   05/10/17 0816 05/10/17 0823 05/10/17 0903 05/10/17 1100  BP: (!) 111/48   (!) 122/43  Pulse: 61   63  Resp: 18   14  Temp: 97.7 F (36.5 C)   98 F (36.7 C)  TempSrc: Oral   Oral  SpO2: 96% 97%  98%  Weight:   69.9 kg (154 lb 1.6 oz)   Height:   5\' 3"  (1.6 m)     Intake/Output Summary (Last 24 hours) at 05/10/2017 1324 Last data filed at 05/10/2017 1120 Gross per 24 hour  Intake 2255 ml  Output 1 ml  Net 2254 ml   Filed Weights   05/10/17 0903  Weight: 69.9 kg (154 lb 1.6 oz)    Examination:  General exam: dry mm, ill appearing Respiratory system: Clear to auscultation. Respiratory effort normal. Cardiovascular system: S1 & S2 heard, RRR. No JVD, murmurs, rubs, gallops or clicks. No pedal edema. Gastrointestinal system: Abdomen is nondistended, soft and nontender. No organomegaly or masses felt. Normal bowel sounds heard. Central nervous system: Alert and oriented. No focal neurological deficits. Extremities: Symmetric 5 x 5 power. Skin: No rashes, lesions or ulcers Psychiatry: Judgement and insight appear normal. Mood & affect appropriate.     Data Reviewed: I have personally reviewed following labs and imaging studies  CBC: Recent Labs  Lab 05/09/17 1146 05/10/17 0223  WBC 7.3 7.4  HGB 12.9 11.5*  HCT 40.0 35.5*  MCV 92.0 91.5  PLT 264 240   Basic Metabolic Panel: Recent Labs  Lab 05/09/17 1146 05/10/17 0223  NA 135 138  K 3.8 3.6  CL 105 110  CO2 20* 19*  GLUCOSE 127* 103*  BUN 24* 19  CREATININE 1.09* 0.97  CALCIUM 8.1* 7.8*  MG  --  1.9   GFR: Estimated Creatinine Clearance: 44.8 mL/min (by C-G formula based on SCr of 0.97 mg/dL). Liver Function Tests: Recent Labs  Lab 05/09/17 1146  AST 30  ALT 26  ALKPHOS 54  BILITOT 0.5  PROT 6.4*  ALBUMIN 3.6   Recent Labs  Lab 05/09/17 1146  LIPASE 25   No results for input(s): AMMONIA in the last 168 hours. Coagulation Profile: No results  for input(s): INR, PROTIME in the last 168 hours. Cardiac Enzymes: No results for input(s): CKTOTAL, CKMB, CKMBINDEX, TROPONINI in the last 168 hours. BNP (last 3 results) No results for input(s): PROBNP in the last 8760 hours. HbA1C: No results for input(s): HGBA1C in the last 72 hours. CBG: Recent Labs  Lab 05/09/17 1145 05/09/17 2324 05/10/17 0419 05/10/17 0830 05/10/17 1155  GLUCAP 121* 91 95 113* 100*   Lipid Profile: No results for input(s): CHOL, HDL, LDLCALC, TRIG, CHOLHDL, LDLDIRECT in the last 72 hours. Thyroid Function Tests: No results for input(s): TSH, T4TOTAL, FREET4, T3FREE, THYROIDAB in the last 72 hours. Anemia Panel: No results for input(s): VITAMINB12, FOLATE, FERRITIN, TIBC, IRON, RETICCTPCT in the last 72 hours. Urine analysis:    Component Value Date/Time   COLORURINE YELLOW 05/09/2017 1121   APPEARANCEUR CLEAR 05/09/2017 1121   LABSPEC 1.031 (H) 05/09/2017 1121   PHURINE 5.0 05/09/2017 1121   GLUCOSEU NEGATIVE 05/09/2017 1121   HGBUR SMALL (A) 05/09/2017 1121   BILIRUBINUR NEGATIVE 05/09/2017 1121   KETONESUR NEGATIVE 05/09/2017 1121   PROTEINUR NEGATIVE 05/09/2017 1121   UROBILINOGEN 0.2 07/26/2010 1249   NITRITE NEGATIVE 05/09/2017 1121   LEUKOCYTESUR MODERATE (A) 05/09/2017 1121      Recent Results (from the past 240 hour(s))  C difficile quick scan w PCR reflex     Status: Abnormal   Collection Time: 05/09/17  5:31 PM  Result Value Ref Range Status   C Diff antigen POSITIVE (A) NEGATIVE Final   C Diff toxin NEGATIVE NEGATIVE Final   C Diff interpretation Results are indeterminate. See PCR results.  Final    Comment: Performed at [facility], [address] [place], [place] [address]  C. Diff by PCR, Reflexed     Status: Abnormal   Collection Time: 05/09/17  5:31 PM  Result Value Ref Range Status   Toxigenic C. Difficile by PCR POSITIVE (A) NEGATIVE Final    Comment: Positive for toxigenic C. difficile with little to no toxin  production. Only treat if clinical presentation suggests symptomatic illness. Performed at [facility], [address] [place], [place] [address]       Anti-infectives (From admission, onward)   Start     Dose/Rate Route Frequency Ordered Stop   05/10/17 0600  metroNIDAZOLE (FLAGYL) IVPB 500 mg  Status:  Discontinued     500 mg 100 mL/hr over 60 Minutes Intravenous Every 8 hours 05/09/17 2309 05/09/17 2352   05/10/17 0000  vancomycin (VANCOCIN) 50 mg/mL oral solution 125 mg     125 mg Oral 4 times daily 05/09/17 2353 05/19/17 2159   05/09/17 2215  metroNIDAZOLE (FLAGYL) tablet 500 mg     500 mg Oral  Once 05/09/17 2206 05/09/17 2221       Radiology Studies: Ct Abdomen Pelvis W  Contrast  Result Date: 05/09/2017 CLINICAL DATA:  Acute presentation with nausea and diarrhea over the last 2 days. Weakness. EXAM: CT ABDOMEN AND PELVIS WITH CONTRAST TECHNIQUE: Multidetector CT imaging of the abdomen and pelvis was performed using the standard protocol following bolus administration of intravenous contrast. CONTRAST:  ISOVUE-300 IOPAMIDOL (ISOVUE-300) INJECTION 61% COMPARISON:  None. FINDINGS: Lower chest: Normal except for small hiatal hernia. Hepatobiliary: Pronounced diffuse fatty change of the liver. Distended gallbladder, but without calcified stones. No wall thickening or surrounding edema. No ductal dilatation. Pancreas: Normal Spleen: Normal Adrenals/Urinary Tract: Adrenal glands are normal. Kidneys are normal. No hydronephrosis. Bladder is normal. Stomach/Bowel: Appendix is normal. No small bowel abnormality. There is diverticulosis of the sigmoid colon but no evidence of diverticulitis. Vascular/Lymphatic: Aortic atherosclerosis. No aneurysm. No adenopathy. Reproductive: Previous hysterectomy.  No pelvic mass. Other: No free fluid or air. Musculoskeletal: Lower lumbar degenerative changes. IMPRESSION: Pronounced hepatic steatosis. Distended gallbladder but without evidence of  stones or inflammation by CT. Small hiatal hernia. Aortic atherosclerosis. Diverticulosis without evidence of diverticulitis. Lumbar degenerative changes. Electronically Signed   By: Paulina Fusi M.D.   On: 05/09/2017 14:02        Scheduled Meds: . aspirin  81 mg Oral Daily  . enoxaparin (LOVENOX) injection  40 mg Subcutaneous Q24H  . insulin aspart  0-5 Units Subcutaneous QHS  . insulin aspart  0-9 Units Subcutaneous TID WC  . levothyroxine  50 mcg Oral QAC breakfast  . ondansetron (ZOFRAN) IV  4 mg Intravenous Once  . pravastatin  40 mg Oral QHS  . sodium chloride flush  3 mL Intravenous Q12H  . vancomycin  125 mg Oral QID   Continuous Infusions: . sodium chloride 100 mL/hr at 05/10/17 1122     LOS: 0 days    Time spent: 35 min    Joseph Art, DO [facility] Pager [phone]  If 7PM-7AM, please contact night-coverage [email or web address] Password [email or web address] 05/10/2017, 1:24 PM

## 2017-05-10 NOTE — ED Notes (Signed)
Breakfast tray ordered 

## 2017-05-10 NOTE — Care Management Obs Status (Signed)
MEDICARE OBSERVATION STATUS NOTIFICATION   Patient Details  Name: Angel Day MRN: [id number] Date of Birth: 03/17/1940   Medicare Observation Status Notification Given:  Yes    Lawerance Sabalebbie Makalynn Berwanger, RN 05/10/2017, 10:56 AM

## 2017-05-11 LAB — GLUCOSE, CAPILLARY
GLUCOSE-CAPILLARY: 93 mg/dL (ref 65–99)
Glucose-Capillary: 108 mg/dL — ABNORMAL HIGH (ref 65–99)
Glucose-Capillary: 101 mg/dL — ABNORMAL HIGH (ref 65–99)
Glucose-Capillary: 88 mg/dL (ref 65–99)

## 2017-05-11 LAB — MISC LABCORP TEST (SEND OUT): Labcorp test code: 183480

## 2017-05-11 LAB — OCCULT BLOOD X 1 CARD TO LAB, STOOL: Fecal Occult Bld: NEGATIVE

## 2017-05-11 NOTE — Progress Notes (Signed)
PROGRESS NOTE    Angel Chestnutorma L Yanes  NGE:[id number] DOB: 10/05/1940 DOA: 05/09/2017 PCP: Pearson GrippeKim, James, MD   Outpatient Specialists:    Brief Narrative:  Angel Day is a 77 y.o. female with medical history significant of HTN, HLD, DM type II, and hypothyroidism; who presents with complaints of 5 days of diarrhea.  She reports having multiple episodes of nonbloody liquid diarrhea since that time with complaints of intermittent crampy abdominal pain. Found to be c diff positive    Assessment & Plan:   Active Problems:   C. difficile diarrhea   Clostridium difficile diarrhea: Acute.  -PO vanc x 10 days QID -plan for d/c home once tolerating PO fluids and stools more solidified - suspect will be better in AM  Nausea and vomiting: Acute.   -improving-- appetite slowly improving (still don't think patient can maintain her hydration w/o IVF due to 11 stools last PM)  Acute kidney injury: Creatinine noted to be 1.09 with BUN 24.  Elevated BUN to creatinine ratio with history of diarrhea to correlate with prerenal cause of symptoms -improved with IVF  Orthostatic hypotension, H/O of Essential Hypotension: Acute.  Patient noted to have low blood pressures initially with EMS and positive orthostatic vital signs on admission.  Suspect secondary to hypovolemia related to diarrhea. -IVF  Diabetes mellitus type 2: Patient normally well controlled on oral medications of metformin which she is been on many years. - Hypoglycemic protocols - Hold metformin   Hypothyroidism - Continue levothyroxine   Dilated common bile duct with hepatic steatosis: patient reported having history of common bile stone seen on MRI of lumbar spine 2 months ago.  CT scan showing hepatic steatosis with dilated common bile duct. - outpatient follow-up  Abnormal UA: Suspect could likely be contaminant -no symptoms -will not treat     DVT prophylaxis:  Lovenox   Code Status: Full Code   Family  Communication: At bedside  Disposition Plan:     Consultants:         Subjective: Had 11+ stools last PM -only 1 so far this AM and was not bile like  Objective: Vitals:   05/10/17 1100 05/10/17 1300 05/10/17 2200 05/11/17 0534  BP: (!) 122/43 124/64 (!) 122/46 (!) 105/50  Pulse: 63 67 62 60  Resp: 14 16 14 16   Temp: 98 F (36.7 C) 98.4 F (36.9 C) 97.9 F (36.6 C) 97.9 F (36.6 C)  TempSrc: Oral Oral Oral Oral  SpO2: 98%  95% 95%  Weight:      Height:        Intake/Output Summary (Last 24 hours) at 05/11/2017 1308 Last data filed at 05/11/2017 0945 Gross per 24 hour  Intake 360 ml  Output -  Net 360 ml   Filed Weights   05/10/17 0903  Weight: 69.9 kg (154 lb 1.6 oz)    Examination:  General exam: sitting on side of bed, mouth still dry but improved skin turgor  Respiratory system: clear Cardiovascular system: rrr Gastrointestinal system: +BS, soft Central nervous system: A+Ox3     Data Reviewed: I have personally reviewed following labs and imaging studies  CBC: Recent Labs  Lab 05/09/17 1146 05/10/17 0223  WBC 7.3 7.4  HGB 12.9 11.5*  HCT 40.0 35.5*  MCV 92.0 91.5  PLT 264 240   Basic Metabolic Panel: Recent Labs  Lab 05/09/17 1146 05/10/17 0223  NA 135 138  K 3.8 3.6  CL 105 110  CO2 20* 19*  GLUCOSE 127* 103*  BUN 24* 19  CREATININE 1.09* 0.97  CALCIUM 8.1* 7.8*  MG  --  1.9   GFR: Estimated Creatinine Clearance: 44.8 mL/min (by C-G formula based on SCr of 0.97 mg/dL). Liver Function Tests: Recent Labs  Lab 05/09/17 1146  AST 30  ALT 26  ALKPHOS 54  BILITOT 0.5  PROT 6.4*  ALBUMIN 3.6   Recent Labs  Lab 05/09/17 1146  LIPASE 25   No results for input(s): AMMONIA in the last 168 hours. Coagulation Profile: No results for input(s): INR, PROTIME in the last 168 hours. Cardiac Enzymes: No results for input(s): CKTOTAL, CKMB, CKMBINDEX, TROPONINI in the last 168 hours. BNP (last 3 results) No results for  input(s): PROBNP in the last 8760 hours. HbA1C: No results for input(s): HGBA1C in the last 72 hours. CBG: Recent Labs  Lab 05/10/17 1155 05/10/17 1628 05/10/17 2150 05/11/17 0624 05/11/17 1120  GLUCAP 100* 101* 112* 93 108*   Lipid Profile: No results for input(s): CHOL, HDL, LDLCALC, TRIG, CHOLHDL, LDLDIRECT in the last 72 hours. Thyroid Function Tests: No results for input(s): TSH, T4TOTAL, FREET4, T3FREE, THYROIDAB in the last 72 hours. Anemia Panel: No results for input(s): VITAMINB12, FOLATE, FERRITIN, TIBC, IRON, RETICCTPCT in the last 72 hours. Urine analysis:    Component Value Date/Time   COLORURINE YELLOW 05/09/2017 1121   APPEARANCEUR CLEAR 05/09/2017 1121   LABSPEC 1.031 (H) 05/09/2017 1121   PHURINE 5.0 05/09/2017 1121   GLUCOSEU NEGATIVE 05/09/2017 1121   HGBUR SMALL (A) 05/09/2017 1121   BILIRUBINUR NEGATIVE 05/09/2017 1121   KETONESUR NEGATIVE 05/09/2017 1121   PROTEINUR NEGATIVE 05/09/2017 1121   UROBILINOGEN 0.2 07/26/2010 1249   NITRITE NEGATIVE 05/09/2017 1121   LEUKOCYTESUR MODERATE (A) 05/09/2017 1121      Recent Results (from the past 240 hour(s))  C difficile quick scan w PCR reflex     Status: Abnormal   Collection Time: 05/09/17  5:31 PM  Result Value Ref Range Status   C Diff antigen POSITIVE (A) NEGATIVE Final   C Diff toxin NEGATIVE NEGATIVE Final   C Diff interpretation Results are indeterminate. See PCR results.  Final    Comment: Performed at [facility], [address], [place], [place] [address]  C. Diff by PCR, Reflexed     Status: Abnormal   Collection Time: 05/09/17  5:31 PM  Result Value Ref Range Status   Toxigenic C. Difficile by PCR POSITIVE (A) NEGATIVE Final    Comment: Positive for toxigenic C. difficile with little to no toxin production. Only treat if clinical presentation suggests symptomatic illness. Performed at [facility], [address], [place], [place] [address]       Anti-infectives (From  admission, onward)   Start     Dose/Rate Route Frequency Ordered Stop   05/10/17 0600  metroNIDAZOLE (FLAGYL) IVPB 500 mg  Status:  Discontinued     500 mg 100 mL/hr over 60 Minutes Intravenous Every 8 hours 05/09/17 2309 05/09/17 2352   05/10/17 0000  vancomycin (VANCOCIN) 50 mg/mL oral solution 125 mg     125 mg Oral 4 times daily 05/09/17 2353 05/19/17 2159   05/09/17 2215  metroNIDAZOLE (FLAGYL) tablet 500 mg     500 mg Oral  Once 05/09/17 2206 05/09/17 2221       Radiology Studies: Ct Abdomen Pelvis W Contrast  Result Date: 05/09/2017 CLINICAL DATA:  Acute presentation with nausea and diarrhea over the last 2 days. Weakness. EXAM: CT ABDOMEN AND PELVIS WITH  CONTRAST TECHNIQUE: Multidetector CT imaging of the abdomen and pelvis was performed using the standard protocol following bolus administration of intravenous contrast. CONTRAST:  ISOVUE-300 IOPAMIDOL (ISOVUE-300) INJECTION 61% COMPARISON:  None. FINDINGS: Lower chest: Normal except for small hiatal hernia. Hepatobiliary: Pronounced diffuse fatty change of the liver. Distended gallbladder, but without calcified stones. No wall thickening or surrounding edema. No ductal dilatation. Pancreas: Normal Spleen: Normal Adrenals/Urinary Tract: Adrenal glands are normal. Kidneys are normal. No hydronephrosis. Bladder is normal. Stomach/Bowel: Appendix is normal. No small bowel abnormality. There is diverticulosis of the sigmoid colon but no evidence of diverticulitis. Vascular/Lymphatic: Aortic atherosclerosis. No aneurysm. No adenopathy. Reproductive: Previous hysterectomy.  No pelvic mass. Other: No free fluid or air. Musculoskeletal: Lower lumbar degenerative changes. IMPRESSION: Pronounced hepatic steatosis. Distended gallbladder but without evidence of stones or inflammation by CT. Small hiatal hernia. Aortic atherosclerosis. Diverticulosis without evidence of diverticulitis. Lumbar degenerative changes. Electronically Signed   By: Paulina Fusi M.D.   On: 05/09/2017 14:02        Scheduled Meds: . aspirin  81 mg Oral Daily  . enoxaparin (LOVENOX) injection  40 mg Subcutaneous Q24H  . insulin aspart  0-5 Units Subcutaneous QHS  . insulin aspart  0-9 Units Subcutaneous TID WC  . levothyroxine  50 mcg Oral QAC breakfast  . ondansetron (ZOFRAN) IV  4 mg Intravenous Once  . pravastatin  40 mg Oral QHS  . sodium chloride flush  3 mL Intravenous Q12H  . vancomycin  125 mg Oral QID   Continuous Infusions: . sodium chloride 100 mL/hr at 05/10/17 1122     LOS: 1 day    Time spent: 35 min    Joseph Art, DO [facility] Pager [phone]  If 7PM-7AM, please contact night-coverage [email or web address] Password [email or web address] 05/11/2017, 1:08 PM

## 2017-05-12 DIAGNOSIS — A0472 Enterocolitis due to Clostridium difficile, not specified as recurrent: Principal | ICD-10-CM

## 2017-05-12 LAB — GLUCOSE, CAPILLARY
GLUCOSE-CAPILLARY: 84 mg/dL (ref 65–99)
GLUCOSE-CAPILLARY: 94 mg/dL (ref 65–99)
Glucose-Capillary: 92 mg/dL (ref 65–99)
Glucose-Capillary: 85 mg/dL (ref 65–99)

## 2017-05-12 LAB — URINE CULTURE

## 2017-05-12 LAB — CBC
HCT: 37.4 % (ref 36.0–46.0)
Hemoglobin: 12 g/dL (ref 12.0–15.0)
MCH: 29.5 pg (ref 26.0–34.0)
MCHC: 32.1 g/dL (ref 30.0–36.0)
MCV: 91.9 fL (ref 78.0–100.0)
PLATELETS: 278 10*3/uL (ref 150–400)
RBC: 4.07 MIL/uL (ref 3.87–5.11)
RDW: 14.5 % (ref 11.5–15.5)
WBC: 9.6 10*3/uL (ref 4.0–10.5)

## 2017-05-12 LAB — COMPREHENSIVE METABOLIC PANEL
ALT: 29 U/L (ref 14–54)
ANION GAP: 9 (ref 5–15)
AST: 31 U/L (ref 15–41)
Albumin: 3.8 g/dL (ref 3.5–5.0)
Alkaline Phosphatase: 47 U/L (ref 38–126)
BUN: 10 mg/dL (ref 6–20)
CHLORIDE: 112 mmol/L — AB (ref 101–111)
CO2: 20 mmol/L — ABNORMAL LOW (ref 22–32)
CREATININE: 0.88 mg/dL (ref 0.44–1.00)
Calcium: 8.2 mg/dL — ABNORMAL LOW (ref 8.9–10.3)
Glucose, Bld: 99 mg/dL (ref 65–99)
Potassium: 3.8 mmol/L (ref 3.5–5.1)
Sodium: 141 mmol/L (ref 135–145)
Total Bilirubin: 0.3 mg/dL (ref 0.3–1.2)
Total Protein: 6.4 g/dL — ABNORMAL LOW (ref 6.5–8.1)

## 2017-05-12 LAB — MAGNESIUM: MAGNESIUM: 2 mg/dL (ref 1.7–2.4)

## 2017-05-12 MED ORDER — POTASSIUM CHLORIDE IN NACL 20-0.9 MEQ/L-% IV SOLN
INTRAVENOUS | Status: DC
Start: 2017-05-12 — End: 2017-05-13
  Administered 2017-05-12: 09:00:00 via INTRAVENOUS
  Administered 2017-05-12: 800 mL via INTRAVENOUS
  Administered 2017-05-13: 1000 mL via INTRAVENOUS
  Filled 2017-05-12 (×3): qty 1000

## 2017-05-12 MED ORDER — SACCHAROMYCES BOULARDII 250 MG PO CAPS
250.0000 mg | ORAL_CAPSULE | Freq: Two times a day (BID) | ORAL | Status: DC
  Administered 2017-05-12 – 2017-05-13 (×3): 250 mg via ORAL
  Filled 2017-05-12 (×3): qty 1

## 2017-05-12 MED ORDER — CHOLESTYRAMINE LIGHT 4 G PO PACK
4.0000 g | PACK | Freq: Four times a day (QID) | ORAL | Status: DC
  Administered 2017-05-12 – 2017-05-13 (×5): 4 g via ORAL
  Filled 2017-05-12 (×7): qty 1

## 2017-05-12 MED ORDER — PANTOPRAZOLE SODIUM 40 MG PO TBEC
40.0000 mg | DELAYED_RELEASE_TABLET | Freq: Every day | ORAL | Status: DC
  Administered 2017-05-12 – 2017-05-13 (×2): 40 mg via ORAL
  Filled 2017-05-12 (×2): qty 1

## 2017-05-12 NOTE — Progress Notes (Signed)
PROGRESS NOTE    Angel Day  ZOX:[id number] DOB: 06/19/1940 DOA: 05/09/2017 PCP: Pearson Grippe, MD    Brief Narrative:77 y.o.femalewith medical history significant ofHTN, HLD, DM type II, and hypothyroidism;who presents with complaints of 5 days of diarrhea. She reports having multiple episodes of nonbloody liquid diarrhea since that time with complaints of intermittent crampy abdominal pain. Found to be c diff positive   Assessment & Plan:   Active Problems:   C. difficile diarrhea  Clostridium difficile diarrhea:Acute.  -PO vanc x 10 days QID -plan for d/c home once tolerating PO fluids and stools more solidified -start ivf.cholestyramine.  Nausea and vomiting:Acute.  -improving-- appetite slowly improving (still don't think patient can maintain her hydration w/o IVF due to 11 stools last PM)  Acute kidney injury:Creatinine noted to be 1.09 with BUN 24. Elevated BUN to creatinine ratio with history of diarrhea to correlate with prerenal cause of symptoms -improved with IVF  Orthostatic hypotension, H/O of Essential Hypotension:Acute. Patient noted to have low blood pressures initially with EMS and positive orthostatic vital signs on admission. Suspect secondary to hypovolemia related to diarrhea. -IVF  Diabetes mellitus type 2:Patient normally well controlled on oral medications of metformin which she is been on many years. -Hypoglycemic protocols - Hold metformin   Hypothyroidism -Continue levothyroxine  Dilated common bile duct with hepatic steatosis:patient reported having history of common bile stone seen on MRI of lumbar spine 2 months ago. CT scan showinghepatic steatosis with dilated common bile duct. - outpatient follow-up  Abnormal WJ:XBJYNWG could likely be contaminant -no symptoms     DVT prophylaxis: lovenox Code Status:full Family Communication: dw sister Disposition Plan:tbd Consultants:  none Procedures:    Antimicrobials:vanco po  Subjective:had 12 to 14 bouts of diarrhea overnight.  Objective: Vitals:   05/11/17 0534 05/11/17 1933 05/12/17 0000 05/12/17 0529  BP: (!) 105/50 (!) 165/59 (!) 146/62 (!) 128/55  Pulse: 60 65  63  Resp: 16 16  16   Temp: 97.9 F (36.6 C) 97.9 F (36.6 C)  98.8 F (37.1 C)  TempSrc: Oral Oral  Oral  SpO2: 95% 100%  95%  Weight:      Height:        Intake/Output Summary (Last 24 hours) at 05/12/2017 1016 Last data filed at 05/11/2017 1500 Gross per 24 hour  Intake 236 ml  Output -  Net 236 ml   Filed Weights   05/10/17 0903  Weight: 69.9 kg (154 lb 1.6 oz)    Examination:  General exam: Appears calm and comfortable  Respiratory system: Clear to auscultation. Respiratory effort normal. Cardiovascular system: S1 & S2 heard, RRR. No JVD, murmurs, rubs, gallops or clicks. No pedal edema. Gastrointestinal system: Abdomen is nondistended, soft and nontender. No organomegaly or masses felt. Normal bowel sounds heard. Central nervous system: Alert and oriented. No focal neurological deficits. Extremities: Symmetric 5 x 5 power. Skin: No rashes, lesions or ulcers Psychiatry: Judgement and insight appear normal. Mood & affect appropriate.     Data Reviewed: I have personally reviewed following labs and imaging studies  CBC: Recent Labs  Lab 05/09/17 1146 05/10/17 0223 05/12/17 0801  WBC 7.3 7.4 9.6  HGB 12.9 11.5* 12.0  HCT 40.0 35.5* 37.4  MCV 92.0 91.5 91.9  PLT 264 240 278   Basic Metabolic Panel: Recent Labs  Lab 05/09/17 1146 05/10/17 0223 05/12/17 0801  NA 135 138 141  K 3.8 3.6 3.8  CL 105 110 112*  CO2 20* 19* 20*  GLUCOSE 127* 103*  99  BUN 24* 19 10  CREATININE 1.09* 0.97 0.88  CALCIUM 8.1* 7.8* 8.2*  MG  --  1.9 2.0   GFR: Estimated Creatinine Clearance: 49.4 mL/min (by C-G formula based on SCr of 0.88 mg/dL). Liver Function Tests: Recent Labs  Lab 05/09/17 1146 05/12/17 0801  AST 30 31  ALT 26 29  ALKPHOS 54  47  BILITOT 0.5 0.3  PROT 6.4* 6.4*  ALBUMIN 3.6 3.8   Recent Labs  Lab 05/09/17 1146  LIPASE 25   No results for input(s): AMMONIA in the last 168 hours. Coagulation Profile: No results for input(s): INR, PROTIME in the last 168 hours. Cardiac Enzymes: No results for input(s): CKTOTAL, CKMB, CKMBINDEX, TROPONINI in the last 168 hours. BNP (last 3 results) No results for input(s): PROBNP in the last 8760 hours. HbA1C: No results for input(s): HGBA1C in the last 72 hours. CBG: Recent Labs  Lab 05/11/17 0624 05/11/17 1120 05/11/17 1625 05/11/17 2152 05/12/17 0619  GLUCAP 93 108* 101* 88 92   Lipid Profile: No results for input(s): CHOL, HDL, LDLCALC, TRIG, CHOLHDL, LDLDIRECT in the last 72 hours. Thyroid Function Tests: No results for input(s): TSH, T4TOTAL, FREET4, T3FREE, THYROIDAB in the last 72 hours. Anemia Panel: No results for input(s): VITAMINB12, FOLATE, FERRITIN, TIBC, IRON, RETICCTPCT in the last 72 hours. Sepsis Labs: No results for input(s): PROCALCITON, LATICACIDVEN in the last 168 hours.  Recent Results (from the past 240 hour(s))  C difficile quick scan w PCR reflex     Status: Abnormal   Collection Time: 05/09/17  5:31 PM  Result Value Ref Range Status   C Diff antigen POSITIVE (A) NEGATIVE Final   C Diff toxin NEGATIVE NEGATIVE Final   C Diff interpretation Results are indeterminate. See PCR results.  Final    Comment: Performed at [facility], [address] [place], [place] [address]  C. Diff by PCR, Reflexed     Status: Abnormal   Collection Time: 05/09/17  5:31 PM  Result Value Ref Range Status   Toxigenic C. Difficile by PCR POSITIVE (A) NEGATIVE Final    Comment: Positive for toxigenic C. difficile with little to no toxin production. Only treat if clinical presentation suggests symptomatic illness. Performed at [facility], [address] [place], [place] [address]          Radiology Studies: No results  found.      Scheduled Meds: . aspirin  81 mg Oral Daily  . cholestyramine light  4 g Oral QID  . enoxaparin (LOVENOX) injection  40 mg Subcutaneous Q24H  . insulin aspart  0-5 Units Subcutaneous QHS  . insulin aspart  0-9 Units Subcutaneous TID WC  . levothyroxine  50 mcg Oral QAC breakfast  . ondansetron (ZOFRAN) IV  4 mg Intravenous Once  . pravastatin  40 mg Oral QHS  . saccharomyces boulardii  250 mg Oral BID  . sodium chloride flush  3 mL Intravenous Q12H  . vancomycin  125 mg Oral QID   Continuous Infusions: . sodium chloride 100 mL/hr at 05/10/17 1122  . 0.9 % NaCl with KCl 20 mEq / L 125 mL/hr at 05/12/17 0851     LOS: 2 days     Alwyn RenElizabeth G Cedric Denison, MD [facility] If 7PM-7AM, please contact night-coverage [email or web address] Password [email or web address] 05/12/2017, 10:16 AM

## 2017-05-12 NOTE — Progress Notes (Signed)
Message sent to Dr Ashley RoyaltyMatthews earlier for pt, pt c/o having reflux, indigestion, burning after taking each dose of oral vancomycin. Pt has been taking prn maalox which helps but is requesting a med that would help a little more continuously.     Dr Ashley RoyaltyMatthews has returned call and ordering protonix for pt.

## 2017-05-13 LAB — GLUCOSE, CAPILLARY
Glucose-Capillary: 91 mg/dL (ref 65–99)
Glucose-Capillary: 85 mg/dL (ref 65–99)

## 2017-05-13 MED ORDER — CHOLESTYRAMINE LIGHT 4 G PO PACK: 4.0000 g | PACK | Freq: Three times a day (TID) | ORAL | 1 refills | Status: DC

## 2017-05-13 MED ORDER — VANCOMYCIN 50 MG/ML ORAL SOLUTION: 125.0000 mg | Freq: Four times a day (QID) | ORAL | 0 refills | Status: DC

## 2017-05-13 MED ORDER — VANCOMYCIN HCL 125 MG PO CAPS: 125.0000 mg | ORAL_CAPSULE | Freq: Four times a day (QID) | ORAL | 0 refills | Status: AC

## 2017-05-13 MED ORDER — SACCHAROMYCES BOULARDII 250 MG PO CAPS: 250.0000 mg | ORAL_CAPSULE | Freq: Two times a day (BID) | ORAL | 0 refills | Status: DC

## 2017-05-13 MED FILL — VANCOMYCIN HCL 125 MG CAP: 125 | 10 days supply | Qty: 40 | Fill #0

## 2017-05-13 NOTE — Discharge Summary (Signed)
Physician Discharge Summary  Angel Day ZOX:[id number] DOB: October 18, 1940 DOA: 05/09/2017  PCP: Pearson Grippe, MD  Admit date: 05/09/2017 Discharge date: 05/13/2017  Admitted From: Home Disposition: Home none Recommendations for Outpatient Follow-up:  1. Follow up with PCP in 1-2 weeks 2. Please obtain BMP/CBC in one week  Home Health: None Equipment/Devices: None  Discharge Condition: Stable CODE STATUS: Full code Diet recommendation cardiac diet Brief/Interim Summary:77 y.o.femalewith medical history significant ofHTN, HLD, DM type II, and hypothyroidism;who presents with complaints of 5 days of diarrhea. She reports having multiple episodes of nonbloody liquid diarrhea since that time with complaints of intermittent crampy abdominal pain. Found to be c diff positive    Discharge Diagnoses:  Active Problems:   C. difficile diarrhea  Clostridium difficile diarrhea:Acute.  -PO vanc x 10 days QID.  DC home today.  Patient has had one bowel movement in the last 24 hours.  I will discharge her on probiotics and cholestyramine and vancomycin.  She is to follow-up with her PCP in 2 weeks.  Nausea and vomiting: Resolved.  Acute kidney injury:Creatinine on the day of discharge 0.88.  Renal functions improved with IV hydration.  Noted to be 1.09 with BUN 24. Elevated BUN to creatinine ratio with history of diarrhea to correlate with prerenal cause of symptoms -improved with IVF   Diabetes mellitus type 2: Restart metformin.   Hypothyroidism -Continue levothyroxine  Dilated common bile duct with hepatic steatosis:patient reported having history of common bile stone seen on MRI of lumbar spine 2 months ago. CT scan showinghepatic steatosis with dilated common bile duct. - outpatient follow-up    Discharge Instructions  Discharge Instructions    Call MD for:  difficulty breathing, headache or visual disturbances   Complete by:  As directed    Call MD for:   extreme fatigue   Complete by:  As directed    Call MD for:  persistant dizziness or light-headedness   Complete by:  As directed    Call MD for:  persistant nausea and vomiting   Complete by:  As directed    Call MD for:  severe uncontrolled pain   Complete by:  As directed    Diet - low sodium heart healthy   Complete by:  As directed    Increase activity slowly   Complete by:  As directed      Allergies as of 05/13/2017      Reactions   Penicillins Anaphylaxis, Swelling   Has patient had a PCN reaction causing immediate rash, facial/tongue/throat swelling, SOB or lightheadedness with hypotension: Yes Has patient had a PCN reaction causing severe rash involving mucus membranes or skin necrosis: Yes Has patient had a PCN reaction that required hospitalization: No Has patient had a PCN reaction occurring within the last 10 years: No If all of the above answers are "NO", then may proceed with Cephalosporin use.      Medication List    TAKE these medications   aspirin 81 MG tablet Take 81 mg by mouth daily.   CALCIUM PO Take 600 mg by mouth daily.   cholestyramine light 4 g packet Commonly known as:  PREVALITE Take 1 packet (4 g total) by mouth 3 (three) times daily.   levothyroxine 50 MCG tablet Commonly known as:  SYNTHROID, LEVOTHROID Take 50 mcg by mouth daily.   losartan 100 MG tablet Commonly known as:  COZAAR Take 100 mg by mouth daily.   metFORMIN 500 MG tablet Commonly known as:  GLUCOPHAGE Take  500 mg by mouth 2 (two) times daily with a meal.   pravastatin 40 MG tablet Commonly known as:  PRAVACHOL Take 40 mg by mouth at bedtime.   saccharomyces boulardii 250 MG capsule Commonly known as:  FLORASTOR Take 1 capsule (250 mg total) by mouth 2 (two) times daily.   vancomycin 50 mg/mL oral solution Commonly known as:  VANCOCIN Take 2.5 mLs (125 mg total) by mouth 4 (four) times daily for 10 days.   Vitamin D3 5000 units Caps Take 5,000 Units by mouth 2  (two) times a week.      Follow-up Information    Pearson Grippe, MD Follow up.   Specialty:  Internal Medicine Contact information: [address] [place] [phone]          Allergies  Allergen Reactions  . Penicillins Anaphylaxis and Swelling    Has patient had a PCN reaction causing immediate rash, facial/tongue/throat swelling, SOB or lightheadedness with hypotension: Yes Has patient had a PCN reaction causing severe rash involving mucus membranes or skin necrosis: Yes Has patient had a PCN reaction that required hospitalization: No Has patient had a PCN reaction occurring within the last 10 years: No If all of the above answers are "NO", then may proceed with Cephalosporin use.     Consultations:  none   Procedures/Studies: Ct Abdomen Pelvis W Contrast  Result Date: 05/09/2017 CLINICAL DATA:  Acute presentation with nausea and diarrhea over the last 2 days. Weakness. EXAM: CT ABDOMEN AND PELVIS WITH CONTRAST TECHNIQUE: Multidetector CT imaging of the abdomen and pelvis was performed using the standard protocol following bolus administration of intravenous contrast. CONTRAST:  ISOVUE-300 IOPAMIDOL (ISOVUE-300) INJECTION 61% COMPARISON:  None. FINDINGS: Lower chest: Normal except for small hiatal hernia. Hepatobiliary: Pronounced diffuse fatty change of the liver. Distended gallbladder, but without calcified stones. No wall thickening or surrounding edema. No ductal dilatation. Pancreas: Normal Spleen: Normal Adrenals/Urinary Tract: Adrenal glands are normal. Kidneys are normal. No hydronephrosis. Bladder is normal. Stomach/Bowel: Appendix is normal. No small bowel abnormality. There is diverticulosis of the sigmoid colon but no evidence of diverticulitis. Vascular/Lymphatic: Aortic atherosclerosis. No aneurysm. No adenopathy. Reproductive: Previous hysterectomy.  No pelvic mass. Other: No free fluid or air. Musculoskeletal: Lower lumbar  degenerative changes. IMPRESSION: Pronounced hepatic steatosis. Distended gallbladder but without evidence of stones or inflammation by CT. Small hiatal hernia. Aortic atherosclerosis. Diverticulosis without evidence of diverticulitis. Lumbar degenerative changes. Electronically Signed   By: Paulina Fusi M.D.   On: 05/09/2017 14:02    (Echo, Carotid, EGD, Colonoscopy, ERCP)    Subjective:   Discharge Exam: Vitals:   05/12/17 2050 05/13/17 0525  BP: (!) 148/62 (!) 117/45  Pulse: (!) 58 65  Resp: 18 18  Temp: 97.6 F (36.4 C) 98 F (36.7 C)  SpO2: 100% 100%   Vitals:   05/12/17 0529 05/12/17 1254 05/12/17 2050 05/13/17 0525  BP: (!) 128/55 (!) 132/58 (!) 148/62 (!) 117/45  Pulse: 63 72 (!) 58 65  Resp: 16 16 18 18   Temp: 98.8 F (37.1 C) 98 F (36.7 C) 97.6 F (36.4 C) 98 F (36.7 C)  TempSrc: Oral Oral Axillary Oral  SpO2: 95% 95% 100% 100%  Weight:      Height:        General: Pt is alert, awake, not in acute distress Cardiovascular: RRR, S1/S2 +, no rubs, no gallops Respiratory: CTA bilaterally, no wheezing, no rhonchi Abdominal: Soft, NT, ND, bowel sounds + Extremities: no edema,  no cyanosis    The results of significant diagnostics from this hospitalization (including imaging, microbiology, ancillary and laboratory) are listed below for reference.     Microbiology: Recent Results (from the past 240 hour(s))  C difficile quick scan w PCR reflex     Status: Abnormal   Collection Time: 05/09/17  5:31 PM  Result Value Ref Range Status   C Diff antigen POSITIVE (A) NEGATIVE Final   C Diff toxin NEGATIVE NEGATIVE Final   C Diff interpretation Results are indeterminate. See PCR results.  Final    Comment: Performed at [facility], [address] [place], [place] [address]  C. Diff by PCR, Reflexed     Status: Abnormal   Collection Time: 05/09/17  5:31 PM  Result Value Ref Range Status   Toxigenic C. Difficile by PCR POSITIVE (A) NEGATIVE Final     Comment: Positive for toxigenic C. difficile with little to no toxin production. Only treat if clinical presentation suggests symptomatic illness. Performed at [facility], [address], [place], [place] [address]   Urine Culture     Status: Abnormal   Collection Time: 05/11/17  6:00 PM  Result Value Ref Range Status   Specimen Description URINE, RANDOM  Final   Special Requests NONE  Final   Culture (A)  Final    <10,000 COLONIES/mL INSIGNIFICANT GROWTH Performed at [facility], [address], [place], [place] [address]    Report Status 05/12/2017 FINAL  Final     Labs: BNP (last 3 results) No results for input(s): BNP in the last 8760 hours. Basic Metabolic Panel: Recent Labs  Lab 05/09/17 1146 05/10/17 0223 05/12/17 0801  NA 135 138 141  K 3.8 3.6 3.8  CL 105 110 112*  CO2 20* 19* 20*  GLUCOSE 127* 103* 99  BUN 24* 19 10  CREATININE 1.09* 0.97 0.88  CALCIUM 8.1* 7.8* 8.2*  MG  --  1.9 2.0   Liver Function Tests: Recent Labs  Lab 05/09/17 1146 05/12/17 0801  AST 30 31  ALT 26 29  ALKPHOS 54 47  BILITOT 0.5 0.3  PROT 6.4* 6.4*  ALBUMIN 3.6 3.8   Recent Labs  Lab 05/09/17 1146  LIPASE 25   No results for input(s): AMMONIA in the last 168 hours. CBC: Recent Labs  Lab 05/09/17 1146 05/10/17 0223 05/12/17 0801  WBC 7.3 7.4 9.6  HGB 12.9 11.5* 12.0  HCT 40.0 35.5* 37.4  MCV 92.0 91.5 91.9  PLT 264 240 278   Cardiac Enzymes: No results for input(s): CKTOTAL, CKMB, CKMBINDEX, TROPONINI in the last 168 hours. BNP: Invalid input(s): POCBNP CBG: Recent Labs  Lab 05/12/17 0619 05/12/17 1231 05/12/17 1640 05/12/17 2139 05/13/17 0617  GLUCAP 92 84 94 85 91   D-Dimer No results for input(s): DDIMER in the last 72 hours. Hgb A1c No results for input(s): HGBA1C in the last 72 hours. Lipid Profile No results for input(s): CHOL, HDL, LDLCALC, TRIG, CHOLHDL, LDLDIRECT in the last 72 hours. Thyroid function studies No results for  input(s): TSH, T4TOTAL, T3FREE, THYROIDAB in the last 72 hours.  Invalid input(s): FREET3 Anemia work up No results for input(s): VITAMINB12, FOLATE, FERRITIN, TIBC, IRON, RETICCTPCT in the last 72 hours. Urinalysis    Component Value Date/Time   COLORURINE YELLOW 05/09/2017 1121   APPEARANCEUR CLEAR 05/09/2017 1121   LABSPEC 1.031 (H) 05/09/2017 1121   PHURINE 5.0 05/09/2017 1121   GLUCOSEU NEGATIVE 05/09/2017 1121   HGBUR SMALL (  A) 05/09/2017 1121   BILIRUBINUR NEGATIVE 05/09/2017 1121   KETONESUR NEGATIVE 05/09/2017 1121   PROTEINUR NEGATIVE 05/09/2017 1121   UROBILINOGEN 0.2 07/26/2010 1249   NITRITE NEGATIVE 05/09/2017 1121   LEUKOCYTESUR MODERATE (A) 05/09/2017 1121   Sepsis Labs Invalid input(s): PROCALCITONIN,  WBC,  LACTICIDVEN Microbiology Recent Results (from the past 240 hour(s))  C difficile quick scan w PCR reflex     Status: Abnormal   Collection Time: 05/09/17  5:31 PM  Result Value Ref Range Status   C Diff antigen POSITIVE (A) NEGATIVE Final   C Diff toxin NEGATIVE NEGATIVE Final   C Diff interpretation Results are indeterminate. See PCR results.  Final    Comment: Performed at [facility], [address] [place], [place] [address]  C. Diff by PCR, Reflexed     Status: Abnormal   Collection Time: 05/09/17  5:31 PM  Result Value Ref Range Status   Toxigenic C. Difficile by PCR POSITIVE (A) NEGATIVE Final    Comment: Positive for toxigenic C. difficile with little to no toxin production. Only treat if clinical presentation suggests symptomatic illness. Performed at [facility], [address] [place], [place] [address]   Urine Culture     Status: Abnormal   Collection Time: 05/11/17  6:00 PM  Result Value Ref Range Status   Specimen Description URINE, RANDOM  Final   Special Requests NONE  Final   Culture (A)  Final    <10,000 COLONIES/mL INSIGNIFICANT GROWTH Performed at [facility], [address] [place], [place] [address]     Report Status 05/12/2017 FINAL  Final     Time coordinating discharge: Over 30 minutes  SIGNED:   Alwyn RenElizabeth G Kushi Kun, MD  [facility] 05/13/2017, 9:17 AM  If 7PM-7AM, please contact night-coverage [email or web address] Password [email or web address]

## 2017-05-13 NOTE — Progress Notes (Signed)
Spoke w patient at the bedside. Updated [facility] that [facility], provided her with map to  [facility], she will have printed Rx given to her at DC. Benefit check pending.

## 2017-05-13 NOTE — Progress Notes (Signed)
Requested Dr Ashley RoyaltyMatthews to fax over Vanc Rx for capsules to [facility] outpatient pharmacy at [phone] as liquid is not covered by insurance. 30 day supply of capsules is $90.

## 2017-05-16 DIAGNOSIS — R739 Hyperglycemia, unspecified: Secondary | ICD-10-CM | POA: Diagnosis not present

## 2017-05-16 DIAGNOSIS — A0472 Enterocolitis due to Clostridium difficile, not specified as recurrent: Secondary | ICD-10-CM | POA: Diagnosis not present

## 2017-05-16 DIAGNOSIS — E7800 Pure hypercholesterolemia, unspecified: Secondary | ICD-10-CM | POA: Diagnosis not present

## 2017-05-16 DIAGNOSIS — E039 Hypothyroidism, unspecified: Secondary | ICD-10-CM | POA: Diagnosis not present

## 2017-05-16 DIAGNOSIS — I10 Essential (primary) hypertension: Secondary | ICD-10-CM | POA: Diagnosis not present

## 2017-05-19 DIAGNOSIS — H43813 Vitreous degeneration, bilateral: Secondary | ICD-10-CM | POA: Diagnosis not present

## 2017-05-19 DIAGNOSIS — H35422 Microcystoid degeneration of retina, left eye: Secondary | ICD-10-CM | POA: Diagnosis not present

## 2017-05-24 DIAGNOSIS — I10 Essential (primary) hypertension: Secondary | ICD-10-CM | POA: Diagnosis not present

## 2017-05-24 DIAGNOSIS — A0472 Enterocolitis due to Clostridium difficile, not specified as recurrent: Secondary | ICD-10-CM | POA: Diagnosis not present

## 2017-05-24 DIAGNOSIS — R195 Other fecal abnormalities: Secondary | ICD-10-CM | POA: Diagnosis not present

## 2017-05-24 DIAGNOSIS — K219 Gastro-esophageal reflux disease without esophagitis: Secondary | ICD-10-CM | POA: Diagnosis not present

## 2017-05-26 DIAGNOSIS — Z1212 Encounter for screening for malignant neoplasm of rectum: Secondary | ICD-10-CM | POA: Diagnosis not present

## 2017-06-09 DIAGNOSIS — E039 Hypothyroidism, unspecified: Secondary | ICD-10-CM | POA: Diagnosis not present

## 2017-06-09 DIAGNOSIS — F419 Anxiety disorder, unspecified: Secondary | ICD-10-CM | POA: Diagnosis not present

## 2017-06-09 DIAGNOSIS — E119 Type 2 diabetes mellitus without complications: Secondary | ICD-10-CM | POA: Diagnosis not present

## 2017-06-09 DIAGNOSIS — I10 Essential (primary) hypertension: Secondary | ICD-10-CM | POA: Diagnosis not present

## 2017-06-09 DIAGNOSIS — Z79899 Other long term (current) drug therapy: Secondary | ICD-10-CM | POA: Diagnosis not present

## 2017-06-14 DIAGNOSIS — K529 Noninfective gastroenteritis and colitis, unspecified: Secondary | ICD-10-CM | POA: Diagnosis not present

## 2017-06-14 DIAGNOSIS — Z86010 Personal history of colonic polyps: Secondary | ICD-10-CM | POA: Diagnosis not present

## 2017-06-14 DIAGNOSIS — R932 Abnormal findings on diagnostic imaging of liver and biliary tract: Secondary | ICD-10-CM | POA: Diagnosis not present

## 2017-06-27 DIAGNOSIS — K828 Other specified diseases of gallbladder: Secondary | ICD-10-CM | POA: Diagnosis not present

## 2017-06-27 DIAGNOSIS — E079 Disorder of thyroid, unspecified: Secondary | ICD-10-CM | POA: Diagnosis not present

## 2017-06-27 DIAGNOSIS — E7800 Pure hypercholesterolemia, unspecified: Secondary | ICD-10-CM | POA: Diagnosis not present

## 2017-06-27 DIAGNOSIS — N134 Hydroureter: Secondary | ICD-10-CM | POA: Diagnosis not present

## 2017-06-27 DIAGNOSIS — I10 Essential (primary) hypertension: Secondary | ICD-10-CM | POA: Diagnosis not present

## 2017-06-27 DIAGNOSIS — R109 Unspecified abdominal pain: Secondary | ICD-10-CM | POA: Diagnosis not present

## 2017-06-27 DIAGNOSIS — R7989 Other specified abnormal findings of blood chemistry: Secondary | ICD-10-CM | POA: Diagnosis not present

## 2017-06-27 DIAGNOSIS — N1330 Unspecified hydronephrosis: Secondary | ICD-10-CM | POA: Diagnosis not present

## 2017-06-27 DIAGNOSIS — R1031 Right lower quadrant pain: Secondary | ICD-10-CM | POA: Diagnosis not present

## 2017-06-27 DIAGNOSIS — Z880 Allergy status to penicillin: Secondary | ICD-10-CM | POA: Diagnosis not present

## 2017-06-27 DIAGNOSIS — Z79899 Other long term (current) drug therapy: Secondary | ICD-10-CM | POA: Diagnosis not present

## 2017-06-29 DIAGNOSIS — B029 Zoster without complications: Secondary | ICD-10-CM | POA: Diagnosis not present

## 2017-07-11 DIAGNOSIS — I10 Essential (primary) hypertension: Secondary | ICD-10-CM | POA: Diagnosis not present

## 2017-07-11 DIAGNOSIS — E039 Hypothyroidism, unspecified: Secondary | ICD-10-CM | POA: Diagnosis not present

## 2017-07-11 DIAGNOSIS — E119 Type 2 diabetes mellitus without complications: Secondary | ICD-10-CM | POA: Diagnosis not present

## 2017-07-12 DIAGNOSIS — R739 Hyperglycemia, unspecified: Secondary | ICD-10-CM | POA: Diagnosis not present

## 2017-07-12 DIAGNOSIS — I10 Essential (primary) hypertension: Secondary | ICD-10-CM | POA: Diagnosis not present

## 2017-07-12 DIAGNOSIS — E039 Hypothyroidism, unspecified: Secondary | ICD-10-CM | POA: Diagnosis not present

## 2018-01-12 DIAGNOSIS — I10 Essential (primary) hypertension: Secondary | ICD-10-CM | POA: Diagnosis not present

## 2018-01-12 DIAGNOSIS — R739 Hyperglycemia, unspecified: Secondary | ICD-10-CM | POA: Diagnosis not present

## 2018-01-19 DIAGNOSIS — K219 Gastro-esophageal reflux disease without esophagitis: Secondary | ICD-10-CM | POA: Diagnosis not present

## 2018-01-19 DIAGNOSIS — E119 Type 2 diabetes mellitus without complications: Secondary | ICD-10-CM | POA: Diagnosis not present

## 2018-01-19 DIAGNOSIS — I10 Essential (primary) hypertension: Secondary | ICD-10-CM | POA: Diagnosis not present

## 2018-02-14 DIAGNOSIS — I10 Essential (primary) hypertension: Secondary | ICD-10-CM | POA: Diagnosis not present

## 2018-02-14 DIAGNOSIS — E785 Hyperlipidemia, unspecified: Secondary | ICD-10-CM | POA: Diagnosis not present

## 2018-02-14 DIAGNOSIS — E118 Type 2 diabetes mellitus with unspecified complications: Secondary | ICD-10-CM | POA: Diagnosis not present

## 2018-03-17 DIAGNOSIS — I10 Essential (primary) hypertension: Secondary | ICD-10-CM | POA: Diagnosis not present

## 2018-03-17 DIAGNOSIS — E785 Hyperlipidemia, unspecified: Secondary | ICD-10-CM | POA: Diagnosis not present

## 2018-03-17 DIAGNOSIS — R079 Chest pain, unspecified: Secondary | ICD-10-CM | POA: Diagnosis not present

## 2018-03-17 DIAGNOSIS — Z0189 Encounter for other specified special examinations: Secondary | ICD-10-CM | POA: Diagnosis not present

## 2018-03-22 DIAGNOSIS — I10 Essential (primary) hypertension: Secondary | ICD-10-CM | POA: Diagnosis not present

## 2018-03-22 DIAGNOSIS — R0789 Other chest pain: Secondary | ICD-10-CM | POA: Diagnosis not present

## 2018-03-30 DIAGNOSIS — E785 Hyperlipidemia, unspecified: Secondary | ICD-10-CM | POA: Diagnosis not present

## 2018-03-30 DIAGNOSIS — R079 Chest pain, unspecified: Secondary | ICD-10-CM | POA: Diagnosis not present

## 2018-03-30 DIAGNOSIS — I10 Essential (primary) hypertension: Secondary | ICD-10-CM | POA: Diagnosis not present

## 2018-03-30 DIAGNOSIS — Z0189 Encounter for other specified special examinations: Secondary | ICD-10-CM | POA: Diagnosis not present

## 2018-05-08 NOTE — Progress Notes (Addendum)
  Patient is here for follow up visit.  Subjective:   @Patient ID: Angel Day, female    DOB: 02/04/1941, 77 y.o.   MRN: [id number]  Chief Complaint  Patient presents with  . Hypertension    F/U for swollen feet and ankle  . Fatigue   Please note that the patient was seen and note was done by me.   HPI   77 y/o Caucasian female with hypertension, hyperlipidemia, GERD, hip arthritis, now with restorsternal chest pain.  Patient's previous workup showed abnormal stress EKG without any SPECT evidence of ischemia or infarction.  Patient is continued to have exertional chest heaviness.  Recently, she is also noticed intermittent leg swelling and bilateral leg pain on exertion. Patient is concerned given her family h/o CAD.    Past Medical History:  Diagnosis Date  . Depression   . Diabetes (HCC)   . High cholesterol   . Hypertension      Past Surgical History:  Procedure Laterality Date  . BREAST REDUCTION SURGERY  1980  . REDUCTION MAMMAPLASTY Bilateral   . REFRACTIVE SURGERY  1991  . VAGINAL HYSTERECTOMY  1992     Social History   Socioeconomic History  . Marital status: Single    Spouse name: Not on file  . Number of children: 3  . Years of education: 14  . Highest education level: Not on file  Occupational History  . Occupation: Retired- banking  Social Needs  . Financial resource strain: Not on file  . Food insecurity:    Worry: Not on file    Inability: Not on file  . Transportation needs:    Medical: Not on file    Non-medical: Not on file  Tobacco Use  . Smoking status: Former Smoker    Last attempt to quit: 03/02/1975    Years since quitting: 43.2  . Smokeless tobacco: Never Used  Substance and Sexual Activity  . Alcohol use: No    Alcohol/week: 0.0 standard drinks  . Drug use: No  . Sexual activity: Not on file  Lifestyle  . Physical activity:    Days per week: Not on file    Minutes per session: Not on file  . Stress: Not on file    Relationships  . Social connections:    Talks on phone: Not on file    Gets together: Not on file    Attends religious service: Not on file    Active member of club or organization: Not on file    Attends meetings of clubs or organizations: Not on file    Relationship status: Not on file  . Intimate partner violence:    Fear of current or ex partner: Not on file    Emotionally abused: Not on file    Physically abused: Not on file    Forced sexual activity: Not on file  Other Topics Concern  . Not on file  Social History Narrative   Lives alone     Current Outpatient Medications on File Prior to Visit  Medication Sig Dispense Refill  . amLODipine (NORVASC) 5 MG tablet Take 1 tablet by mouth daily.    . aspirin 81 MG tablet Take 81 mg by mouth daily.    . Cholecalciferol (VITAMIN D3) 5000 units CAPS Take 5,000 Units by mouth 2 (two) times a week.     . hydrochlorothiazide (HYDRODIURIL) 25 MG tablet Take 1 tablet by mouth daily.    . levothyroxine (SYNTHROID, LEVOTHROID) 50 MCG tablet   Take 50 mcg by mouth daily.  5  . losartan (COZAAR) 100 MG tablet Take 100 mg by mouth daily.  10  . metFORMIN (GLUCOPHAGE) 500 MG tablet Take 500 mg by mouth 2 (two) times daily with a meal.   3  . rosuvastatin (CRESTOR) 20 MG tablet Take 1 tablet by mouth every evening.    Marland Kitchen CALCIUM PO Take 600 mg by mouth daily.    Marland Kitchen saccharomyces boulardii (FLORASTOR) 250 MG capsule Take 1 capsule (250 mg total) by mouth 2 (two) times daily. (Patient not taking: Reported on 05/10/2018) 60 capsule 0   No current facility-administered medications on file prior to visit.     Cardiovascular studies:   Exercise myoview stress 03/22/2018:  1. The patient performed treadmill exercise using Bruce protocol, completing 5:01 minutes. The patient completed an estimated workload of 7 METS, reaching 86% of the maximum predicted heart rate. Exercise capacity was low. Hemodynamic response was normal. Stress symptoms included  exercise induced chest pain, relieved with rest.  Stress electrocardiogram was positive for ischemia, showing 1.5-2 mm upsloping ST depressions in leads II, III, aVF. ST-T changes returned to baseline 2 min into recovery. 2. The overall quality of the study is good. There is no evidence of abnormal lung activity. Stress and rest SPECT images demonstrate homogeneous tracer distribution throughout the myocardium. Gated SPECT imaging reveals normal myocardial thickening and wall motion. The left ventricular ejection fraction was normal (57%).   3. Intermediate risk study due to EKG changes and exercise induced chest pain. Clinical correlation recommended.  EKG 03/17/2018: Sinus rhythm 73 bpm. Normal axis. Normal conduction. Normal EKG.  Recent labs: 01/13/2018: Chol 206, TG 197, HDL 43, LDL 124 Labs 01/12/2018: Glucose 124. Cr 0.94. EGFR 58/67. Sodium 141, potassium 5.2. Rest of the CMP normal. H/H 12/37 MCV 94. Platelets 302. Hemoglobin A1c 6.1% TSH normal   Review of Systems  Constitution: Negative for decreased appetite, malaise/fatigue, weight gain and weight loss.  HENT: Negative for congestion.   Eyes: Negative for visual disturbance.  Cardiovascular: Positive for chest pain, dyspnea on exertion and orthopnea. Negative for leg swelling, palpitations and syncope.  Respiratory: Negative for shortness of breath.   Endocrine: Negative for cold intolerance.  Hematologic/Lymphatic: Does not bruise/bleed easily.  Skin: Negative for itching and rash.  Musculoskeletal: Positive for joint pain (Left hip pain). Negative for myalgias.  Gastrointestinal: Negative for abdominal pain, nausea and vomiting.  Genitourinary: Negative for dysuria.  Neurological: Negative for dizziness and weakness.  Psychiatric/Behavioral: The patient is not nervous/anxious.        Anxiety   All other systems reviewed and are negative.      Objective:    Vitals:   05/10/18 1447  BP: 136/62  Pulse: 65  SpO2: 94%       Physical Exam  Constitutional: She is oriented to person, place, and time. She appears well-developed and well-nourished. No distress.  HENT:  Head: Normocephalic and atraumatic.  Eyes: Pupils are equal, round, and reactive to light. Conjunctivae are normal.  Neck: No JVD present.  Cardiovascular: Normal rate and regular rhythm.  No murmur heard. Pulses:      Femoral pulses are 2+ on the right side and 2+ on the left side.      Popliteal pulses are 2+ on the right side and 2+ on the left side.       Dorsalis pedis pulses are 2+ on the right side and 1+ on the left side.  Posterior tibial pulses are 2+ on the right side and 2+ on the left side.  Pulmonary/Chest: Effort normal and breath sounds normal. She has no wheezes. She has no rales.  Abdominal: Soft. Bowel sounds are normal. There is no rebound.  Musculoskeletal:        General: No edema.  Lymphadenopathy:    She has no cervical adenopathy.  Neurological: She is alert and oriented to person, place, and time. No cranial nerve deficit.  Skin: Skin is warm and dry.  Psychiatric: She has a normal mood and affect.  Nursing note and vitals reviewed.       Assessment & Recommendations:    77 y/o Caucasian female with hypertension, hyperlipidemia, GERD, hip arthritis, with recurrent restorsternal chest pain.  Chest pain: Previous workup showed abnormal stress EKG without any SPECT evidence of ischemia or infarction.  Given her persistent symptoms and risk factors for CAD, recommend coronary CT angiogram for anatomy evaluation. Continue aspirin/statin. Will also obtain echocardiogram  Leg pain: She does have reduced Lt DP pulse, although her symptoms of bilateral leg pain are out of proportion to this finding. Will obtain bilateral ABI.    Hypertension: Well controlled on current antihypertensive therapy.  I will see her back in 6 weeks.    Please note that the patient was seen and note was done by me.   Manish  J Patwardhan, MD [facility] Pager: [phone] Office: [phone] If no answer Cell [phone] 

## 2018-05-10 ENCOUNTER — Other Ambulatory Visit: Payer: Self-pay

## 2018-05-10 ENCOUNTER — Encounter: Payer: Self-pay | Admitting: Cardiology

## 2018-05-10 ENCOUNTER — Ambulatory Visit (INDEPENDENT_AMBULATORY_CARE_PROVIDER_SITE_OTHER): Payer: PPO | Admitting: Cardiology

## 2018-05-10 VITALS — BP 136/62 | HR 65 | Ht 64.0 in | Wt 155.0 lb

## 2018-05-10 DIAGNOSIS — R079 Chest pain, unspecified: Secondary | ICD-10-CM

## 2018-05-10 DIAGNOSIS — R600 Localized edema: Secondary | ICD-10-CM

## 2018-05-10 DIAGNOSIS — I10 Essential (primary) hypertension: Secondary | ICD-10-CM

## 2018-05-10 DIAGNOSIS — M79605 Pain in left leg: Secondary | ICD-10-CM

## 2018-05-10 DIAGNOSIS — M79604 Pain in right leg: Secondary | ICD-10-CM | POA: Diagnosis not present

## 2018-05-11 ENCOUNTER — Encounter: Payer: Self-pay | Admitting: Cardiology

## 2018-05-11 DIAGNOSIS — M79605 Pain in left leg: Secondary | ICD-10-CM

## 2018-05-11 DIAGNOSIS — R079 Chest pain, unspecified: Secondary | ICD-10-CM | POA: Insufficient documentation

## 2018-05-11 DIAGNOSIS — R600 Localized edema: Secondary | ICD-10-CM | POA: Insufficient documentation

## 2018-05-11 DIAGNOSIS — M79604 Pain in right leg: Secondary | ICD-10-CM | POA: Insufficient documentation

## 2018-05-12 DIAGNOSIS — E039 Hypothyroidism, unspecified: Secondary | ICD-10-CM | POA: Diagnosis not present

## 2018-05-12 DIAGNOSIS — R739 Hyperglycemia, unspecified: Secondary | ICD-10-CM | POA: Diagnosis not present

## 2018-05-12 DIAGNOSIS — I10 Essential (primary) hypertension: Secondary | ICD-10-CM | POA: Diagnosis not present

## 2018-05-12 DIAGNOSIS — E7800 Pure hypercholesterolemia, unspecified: Secondary | ICD-10-CM | POA: Diagnosis not present

## 2018-05-12 DIAGNOSIS — E7841 Elevated Lipoprotein(a): Secondary | ICD-10-CM | POA: Diagnosis not present

## 2018-05-14 ENCOUNTER — Ambulatory Visit (HOSPITAL_COMMUNITY)
Admission: EM | Admit: 2018-05-14 | Discharge: 2018-05-14 | Disposition: A | Discharge status: Home / Self Care | Payer: PPO | Attending: Internal Medicine | Admitting: Internal Medicine

## 2018-05-14 ENCOUNTER — Encounter (HOSPITAL_COMMUNITY): Payer: Self-pay | Admitting: Emergency Medicine

## 2018-05-14 ENCOUNTER — Other Ambulatory Visit: Payer: Self-pay

## 2018-05-14 DIAGNOSIS — J40 Bronchitis, not specified as acute or chronic: Secondary | ICD-10-CM

## 2018-05-14 MED ORDER — AZITHROMYCIN 250 MG PO TABS: ORAL_TABLET | ORAL | 0 refills | Status: DC

## 2018-05-14 MED ORDER — IPRATROPIUM-ALBUTEROL 0.5-2.5 (3) MG/3ML IN SOLN
3.0000 mL | Freq: Once | RESPIRATORY_TRACT | Status: AC
  Administered 2018-05-14: 3 mL via RESPIRATORY_TRACT

## 2018-05-14 MED ORDER — GUAIFENESIN-CODEINE 100-10 MG/5ML PO SOLN: 5.0000 mL | Freq: Four times a day (QID) | ORAL | 0 refills | Status: DC | PRN

## 2018-05-14 MED ORDER — ALBUTEROL SULFATE HFA 108 (90 BASE) MCG/ACT IN AERS: 2.0000 | INHALATION_SPRAY | Freq: Four times a day (QID) | RESPIRATORY_TRACT | 2 refills | Status: DC | PRN

## 2018-05-14 MED ORDER — IPRATROPIUM-ALBUTEROL 0.5-2.5 (3) MG/3ML IN SOLN
RESPIRATORY_TRACT | Status: AC
  Filled 2018-05-14: qty 3

## 2018-05-14 NOTE — ED Provider Notes (Signed)
[facility]    CSN: [id number] Arrival date & time: 05/14/18  1122     History   Chief Complaint Chief Complaint  Patient presents with  . Cough    HPI Angel Day is a 78 y.o. female.   Onset of cough 3 days ago with no nasal symptoms til the next day, and gradually the cough got worse with cough attacks, and started wheezing last night. She was exposed to her grandson for URI symptoms who had asthma, as well as her daughter who also has had symptoms. She has aches, but cant tell if it's her regular RA joint pains.     Past Medical History:  Diagnosis Date  . Depression   . Diabetes (HCC)   . High cholesterol   . Hypertension     Patient Active Problem List   Diagnosis Date Noted  . Exertional chest pain 05/11/2018  . Pain in both lower extremities 05/11/2018  . Leg edema 05/11/2018  . C. difficile diarrhea 05/09/2017  . Facial twitching 03/31/2015  . Tinnitus 03/31/2015  . Essential hypertension 03/31/2015  . Impaired glucose tolerance 03/31/2015    Past Surgical History:  Procedure Laterality Date  . BREAST REDUCTION SURGERY  1980  . REDUCTION MAMMAPLASTY Bilateral   . REFRACTIVE SURGERY  1991  . VAGINAL HYSTERECTOMY  1992    OB History   No obstetric history on file.      Home Medications    Prior to Admission medications   Medication Sig Start Date End Date Taking? Authorizing Provider  amLODipine (NORVASC) 5 MG tablet Take 1 tablet by mouth daily. 03/30/18  Yes [provider]  aspirin 81 MG tablet Take 81 mg by mouth daily.   Yes [provider]  hydrochlorothiazide (HYDRODIURIL) 25 MG tablet Take 1 tablet by mouth daily. 04/27/18  Yes [provider]  levothyroxine (SYNTHROID, LEVOTHROID) 50 MCG tablet Take 50 mcg by mouth daily. 03/02/17  Yes [provider]  losartan (COZAAR) 100 MG tablet Take 100 mg by mouth daily. 04/13/17  Yes [provider]  metFORMIN (GLUCOPHAGE) 500 MG tablet  Take 500 mg by mouth 2 (two) times daily with a meal.  02/08/15  Yes [provider]  rosuvastatin (CRESTOR) 20 MG tablet Take 1 tablet by mouth every evening. 03/30/18  Yes [provider]  albuterol (PROVENTIL HFA;VENTOLIN HFA) 108 (90 Base) MCG/ACT inhaler Inhale 2 puffs into the lungs every 6 (six) hours as needed for up to 3 days for wheezing or shortness of breath. Then prn for cough and wheezing after that 05/14/18 05/17/18  Rodriguez-Southworth, Nettie Elm, PA-C  azithromycin (ZITHROMAX Z-PAK) 250 MG tablet Take 2 today, then 1 qd x 4 days 05/14/18   Rodriguez-Southworth, Nettie Elm, PA-C  CALCIUM PO Take 600 mg by mouth daily.    [provider]  Cholecalciferol (VITAMIN D3) 5000 units CAPS Take 5,000 Units by mouth 2 (two) times a week.     [provider]  guaiFENesin-codeine 100-10 MG/5ML syrup Take 5 mLs by mouth every 6 (six) hours as needed for cough. 05/14/18   Rodriguez-Southworth, Nettie Elm, PA-C    Family History Family History  Problem Relation Age of Onset  . Breast cancer Mother   . Cancer Mother        sarcoma    Social History Social History   Tobacco Use  . Smoking status: Former Smoker    Last attempt to quit: 03/02/1975    Years since quitting: 43.2  . Smokeless  tobacco: Never Used  Substance Use Topics  . Alcohol use: No    Alcohol/week: 0.0 standard drinks  . Drug use: No     Allergies   Penicillins   Review of Systems Review of Systems  Constitutional: Positive for chills and fever. Negative for diaphoresis and fatigue.       Has felt she had a fever, but never took her temp  HENT: Positive for congestion, postnasal drip, rhinorrhea, sneezing and sore throat. Negative for ear discharge, ear pain, sinus pressure, sinus pain and trouble swallowing.   Eyes: Negative for discharge and itching.  Respiratory: Positive for cough and wheezing. Negative for chest tightness and shortness of breath.        Her upper central chest fells  sore  Cardiovascular: Negative for chest pain, palpitations and leg swelling.  Gastrointestinal: Negative for diarrhea and vomiting.  Genitourinary: Negative for difficulty urinating.  Musculoskeletal: Positive for arthralgias.  Skin: Negative for rash.  Neurological: Negative for dizziness and headaches.  Hematological: Negative for adenopathy.   Physical Exam Triage Vital Signs ED Triage Vitals [05/14/18 1200]  Enc Vitals Group     BP (!) 144/71     Pulse Rate 86     Resp 18     Temp 98.2 F (36.8 C)     Temp Source Temporal     SpO2 97 %     Weight      Height      Head Circumference      Peak Flow      Pain Score 5     Pain Loc      Pain Edu?      Excl. in GC?    No data found.  Updated Vital Signs BP (!) 144/71 (BP Location: Right Arm)   Pulse 86   Temp 98.2 F (36.8 C) (Temporal)   Resp 18   SpO2 97%   Visual Acuity Right Eye Distance:   Left Eye Distance:   Bilateral Distance:    Right Eye Near:   Left Eye Near:    Bilateral Near:     Physical Exam Vitals signs and nursing note reviewed.  Constitutional:      General: She is not in acute distress.    Appearance: She is ill-appearing. She is not toxic-appearing.  HENT:     Right Ear: Tympanic membrane, ear canal and external ear normal.     Left Ear: Tympanic membrane, ear canal and external ear normal.     Nose: Congestion and rhinorrhea present.     Mouth/Throat:     Mouth: Mucous membranes are moist.     Pharynx: No oropharyngeal exudate or posterior oropharyngeal erythema.  Eyes:     General: No scleral icterus.    Conjunctiva/sclera: Conjunctivae normal.  Neck:     Musculoskeletal: Neck supple. No neck rigidity or muscular tenderness.  Cardiovascular:     Rate and Rhythm: Normal rate and regular rhythm.     Heart sounds: No murmur.  Pulmonary:     Effort: Pulmonary effort is normal.     Breath sounds: Normal breath sounds. No wheezing, rhonchi or rales.     Comments: Cant exhale hard  due to provoking her to cough Musculoskeletal: Normal range of motion.  Lymphadenopathy:     Cervical: No cervical adenopathy.  Skin:    General: Skin is warm and dry.     Findings: No lesion or rash.  Neurological:     Mental Status: She is alert and  oriented to person, place, and time.  Psychiatric:        Mood and Affect: Mood normal.        Behavior: Behavior normal.        Thought Content: Thought content normal.        Judgment: Judgment normal.   Post Neb pulse ox 98% and was able to exhale without having a cough attack.  UC Treatments / Results  Labs (all labs ordered are listed, but only abnormal results are displayed) Labs Reviewed - No data to display  EKG None  Radiology No results found.  Procedures Procedures  Medications Ordered in UC Medications  ipratropium-albuterol (DUONEB) 0.5-2.5 (3) MG/3ML nebulizer solution 3 mL (3 mLs Nebulization Given 05/14/18 1239)    Initial Impression / Assessment and Plan / UC Course  I have reviewed the triage vital signs and the nursing notes. I believe she is getting bacterial bronchitis. I placed her on Albuterol inhaler, Zapack and Guaifenesin with codeine as noted.  Fu with PCP if she does not improve in 49-72h.   Final Clinical Impressions(s) / UC Diagnoses   Final diagnoses:  Bronchitis     Discharge Instructions     Make sure you are re-checked in 72 hours if you get worse.     ED Prescriptions    Medication Sig Dispense Auth. Provider   azithromycin (ZITHROMAX Z-PAK) 250 MG tablet Take 2 today, then 1 qd x 4 days 6 tablet Rodriguez-Southworth, Dezman Granda, PA-C   guaiFENesin-codeine 100-10 MG/5ML syrup Take 5 mLs by mouth every 6 (six) hours as needed for cough. 120 mL Rodriguez-Southworth, Nettie Elm, PA-C   albuterol (PROVENTIL HFA;VENTOLIN HFA) 108 (90 Base) MCG/ACT inhaler Inhale 2 puffs into the lungs every 6 (six) hours as needed for up to 3 days for wheezing or shortness of breath. Then prn for cough and  wheezing after that 1 Inhaler Rodriguez-Southworth, Nettie Elm, PA-C     Controlled Substance Prescriptions [place] Controlled Substance Registry consulted? yes   Garey Ham, Cordelia Poche 05/14/18 1926

## 2018-05-14 NOTE — ED Triage Notes (Signed)
Pt presents to [facility] for 3 days of "non-stop cough" and wheezing when laying down for bed last night.  C/o nasal congestion, body aches (but states hx of RA and not sure what is new or old).  Denies n/v/d.

## 2018-05-14 NOTE — Discharge Instructions (Signed)
Make sure you are re-checked in 72 hours if you get worse.

## 2018-05-19 ENCOUNTER — Other Ambulatory Visit: Payer: PPO

## 2018-05-22 DIAGNOSIS — I10 Essential (primary) hypertension: Secondary | ICD-10-CM | POA: Diagnosis not present

## 2018-05-22 DIAGNOSIS — E785 Hyperlipidemia, unspecified: Secondary | ICD-10-CM | POA: Diagnosis not present

## 2018-05-22 DIAGNOSIS — Z0000 Encounter for general adult medical examination without abnormal findings: Secondary | ICD-10-CM | POA: Diagnosis not present

## 2018-05-22 DIAGNOSIS — E118 Type 2 diabetes mellitus with unspecified complications: Secondary | ICD-10-CM | POA: Diagnosis not present

## 2018-05-22 DIAGNOSIS — E039 Hypothyroidism, unspecified: Secondary | ICD-10-CM | POA: Diagnosis not present

## 2018-05-22 DIAGNOSIS — J309 Allergic rhinitis, unspecified: Secondary | ICD-10-CM | POA: Diagnosis not present

## 2018-06-22 ENCOUNTER — Other Ambulatory Visit: Payer: Self-pay | Admitting: Cardiology

## 2018-06-22 DIAGNOSIS — E785 Hyperlipidemia, unspecified: Secondary | ICD-10-CM

## 2018-06-28 ENCOUNTER — Encounter: Payer: Self-pay | Admitting: Cardiology

## 2018-06-28 ENCOUNTER — Other Ambulatory Visit: Payer: Self-pay

## 2018-06-28 ENCOUNTER — Ambulatory Visit: Payer: PPO | Admitting: Cardiology

## 2018-06-28 VITALS — BP 134/63 | Ht 64.0 in | Wt 147.0 lb

## 2018-06-28 DIAGNOSIS — M79605 Pain in left leg: Secondary | ICD-10-CM | POA: Diagnosis not present

## 2018-06-28 DIAGNOSIS — R0609 Other forms of dyspnea: Secondary | ICD-10-CM | POA: Diagnosis not present

## 2018-06-28 DIAGNOSIS — M79604 Pain in right leg: Secondary | ICD-10-CM

## 2018-06-28 NOTE — Progress Notes (Signed)
   Telephone visit note  Subjective:   Angel Day, female    DOB: 1940-04-24, 78 y.o.   MRN: [id number]   I connected with the patient on 06/28/18 by a telephone call and verified that I am speaking with the correct person using two identifiers.     I offered the patient a video enabled application for a virtual visit. Unfortunately, this could not be accomplished due to technical difficulties/lack of video enabled phone/computer. I discussed the limitations of evaluation and management by telemedicine and the availability of in person appointments. The patient expressed understanding and agreed to proceed.   This visit type was conducted due to national recommendations for restrictions regarding the COVID-19 Pandemic (e.g. social distancing).  This format is felt to be most appropriate for this patient at this time.  All issues noted in this document were discussed and addressed.  No physical exam was performed (except for noted visual exam findings with Tele health visits).  The patient has consented to conduct a Tele health visit and understands insurance will be billed.   Chief complaint:  Chest pain   HPI  78 y/o Caucasian female with hypertension, hyperlipidemia, GERD, hip arthritis, with recurrent restorsternal chest pain.  At last office visit on 03/11/20120, I had recommended coronary CTA for anatomic evaluation, given her recurrent chest pain episodes, with prior stress test showing EKG abnormalities concerning for ischemia without any SPECT evidence of ischemia/infarction. Unfortunately, she could not have the CTA performed due to COVID pandemic.   While her chest pain is only intermittent, she continues to have dyspnea with minimal activity, such as working in the garden. She also has bilateral leg pain with prior abnormal vascular exam,   I recommend proceeding with echocardiogram, ABI, and office visit on the same day. Coronary CTA can be performed in the near future, when  restriction on elective testing in the hospital are relaxed and patient feels comfortable.

## 2018-07-07 ENCOUNTER — Other Ambulatory Visit: Payer: Self-pay | Admitting: Cardiology

## 2018-07-07 DIAGNOSIS — R079 Chest pain, unspecified: Secondary | ICD-10-CM

## 2018-07-07 NOTE — Progress Notes (Signed)
bmp 

## 2018-07-10 DIAGNOSIS — R079 Chest pain, unspecified: Secondary | ICD-10-CM | POA: Diagnosis not present

## 2018-07-10 NOTE — Addendum Note (Signed)
Addended by: Elder Negus on: 07/10/2018 03:53 PM   Modules accepted: Orders

## 2018-07-11 ENCOUNTER — Telehealth (HOSPITAL_COMMUNITY): Payer: Self-pay | Admitting: Emergency Medicine

## 2018-07-11 NOTE — Telephone Encounter (Signed)
Reaching out to patient to offer assistance regarding upcoming cardiac imaging study; pt verbalizes understanding of appt date/time, parking situation and where to check in, pre-test NPO status and medications ordered, and verified current allergies; name and call back number provided for further questions should they arise Rockwell Alexandria RN Navigator Cardiac Imaging [facility] [phone] office [phone] cell   Pt denies covid 19 symptoms, verbalized understanding of visitor policy.   Pt instructed to hold metformin tonight and tomorrow, will hold HCTZ in the AM before test

## 2018-07-12 ENCOUNTER — Ambulatory Visit (HOSPITAL_COMMUNITY)
Admission: RE | Admit: 2018-07-12 | Discharge: 2018-07-12 | Disposition: A | Discharge status: Home / Self Care | Payer: PPO | Source: Ambulatory Visit | Attending: Cardiology | Admitting: Cardiology

## 2018-07-12 ENCOUNTER — Other Ambulatory Visit: Payer: Self-pay

## 2018-07-12 DIAGNOSIS — I700 Atherosclerosis of aorta: Secondary | ICD-10-CM | POA: Diagnosis not present

## 2018-07-12 DIAGNOSIS — I25119 Atherosclerotic heart disease of native coronary artery with unspecified angina pectoris: Secondary | ICD-10-CM | POA: Diagnosis not present

## 2018-07-12 DIAGNOSIS — R079 Chest pain, unspecified: Secondary | ICD-10-CM | POA: Diagnosis not present

## 2018-07-12 LAB — POCT I-STAT CREATININE: Creatinine, Ser: 1.1 mg/dL — ABNORMAL HIGH (ref 0.44–1.00)

## 2018-07-12 MED ORDER — NITROGLYCERIN 0.4 MG SL SUBL
0.8000 mg | SUBLINGUAL_TABLET | Freq: Once | SUBLINGUAL | Status: AC
  Administered 2018-07-12: 0.8 mg via SUBLINGUAL

## 2018-07-12 MED ORDER — IOHEXOL 350 MG/ML SOLN
80.0000 mL | Freq: Once | INTRAVENOUS | Status: AC | PRN
  Administered 2018-07-12: 13:00:00 80 mL via INTRAVENOUS

## 2018-07-12 MED ORDER — NITROGLYCERIN 0.4 MG SL SUBL
SUBLINGUAL_TABLET | SUBLINGUAL | Status: AC
  Filled 2018-07-12: qty 2

## 2018-07-13 IMAGING — MG DIGITAL SCREENING BILATERAL MAMMOGRAM WITH CAD
5 series · 5 of 5 positions shown · non-contrast
Comparison: Previous exam(s).

CLINICAL DATA: Screening. History of bilateral breast reduction
mammoplasty in 9778.

EXAM:
DIGITAL SCREENING BILATERAL MAMMOGRAM WITH CAD

[R CC (1 of 2)]
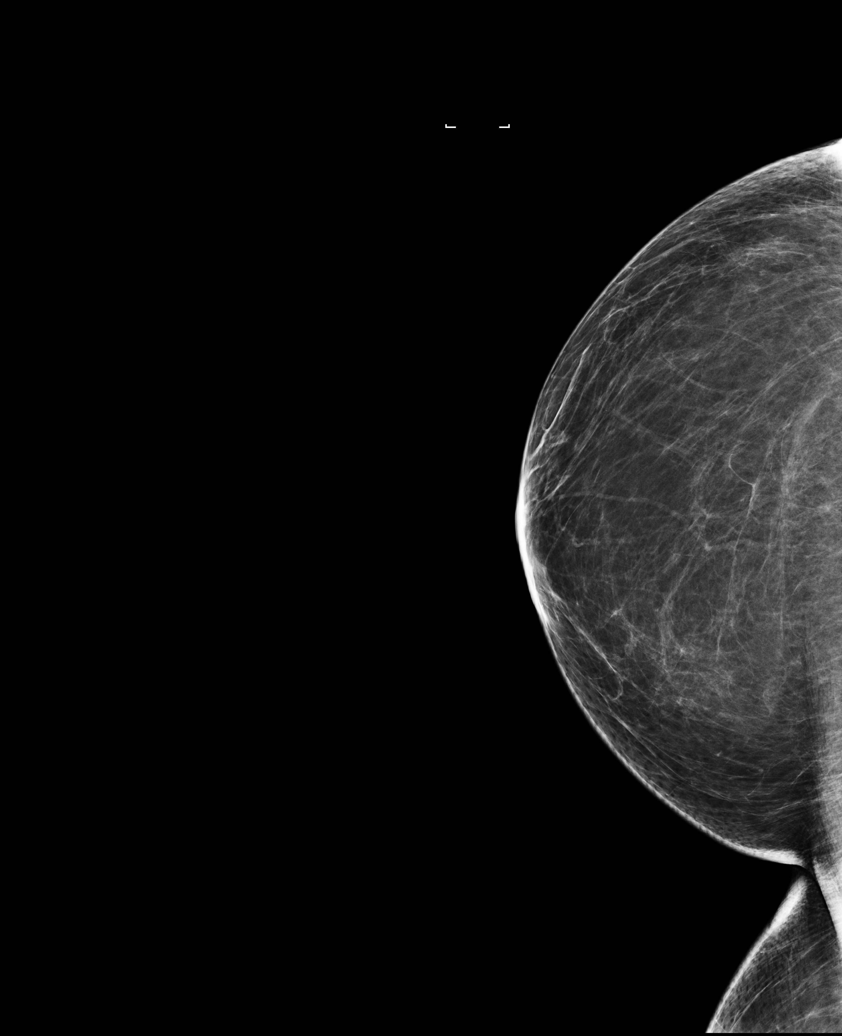

[R CC (2 of 2)]
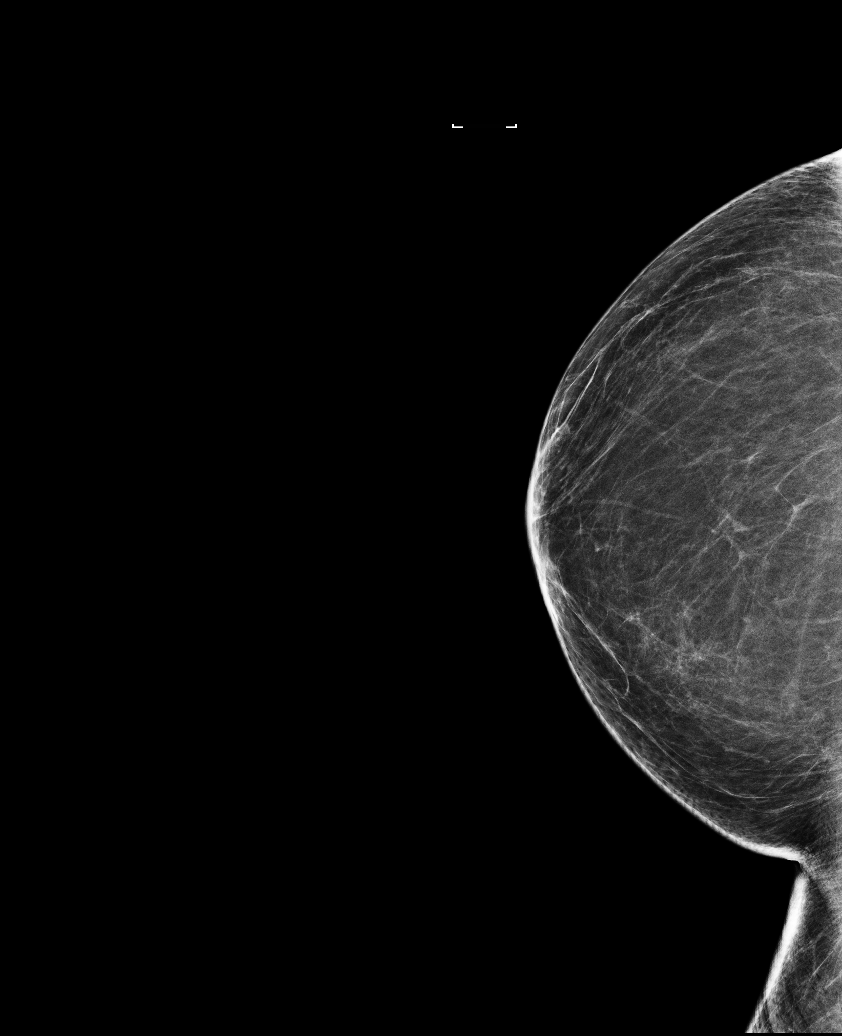

[L CC]
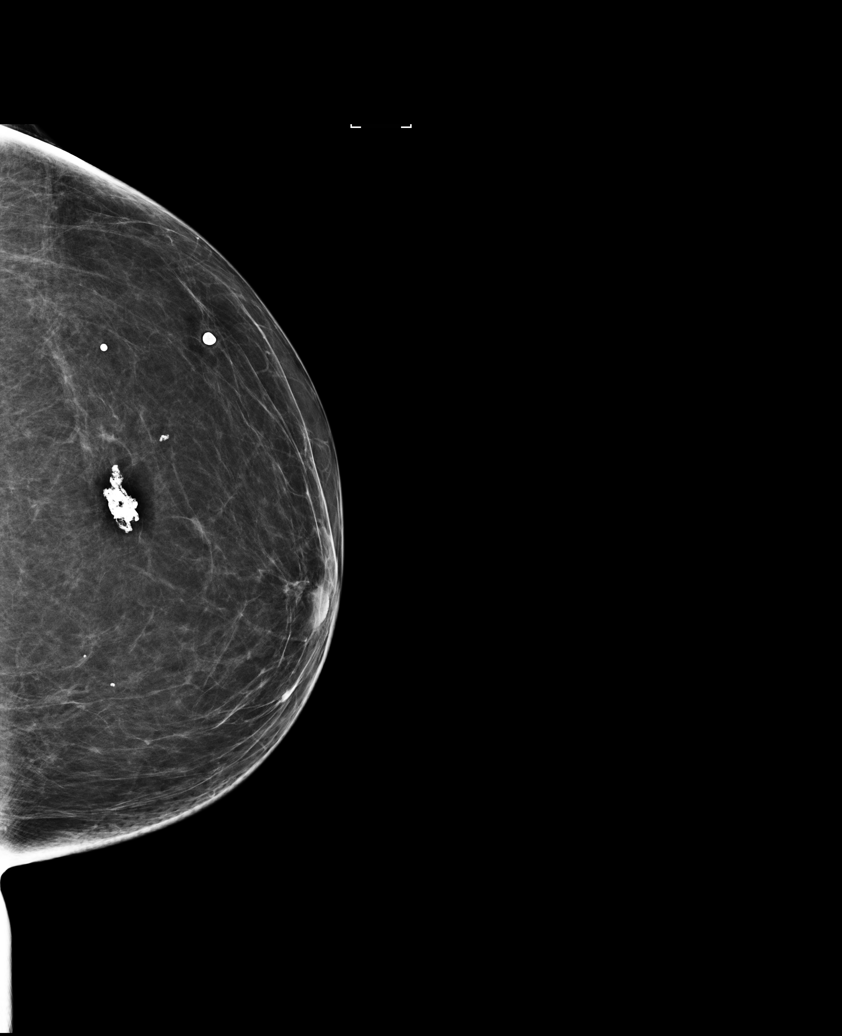

[R MLO]
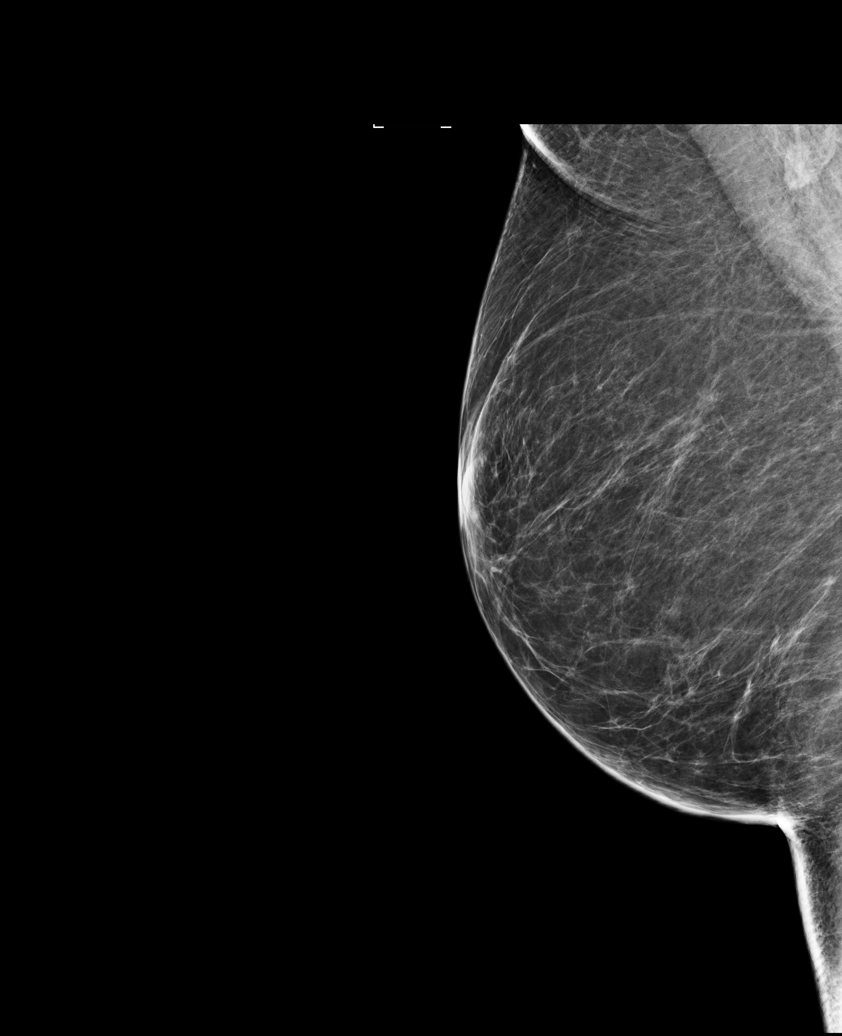

[L MLO]
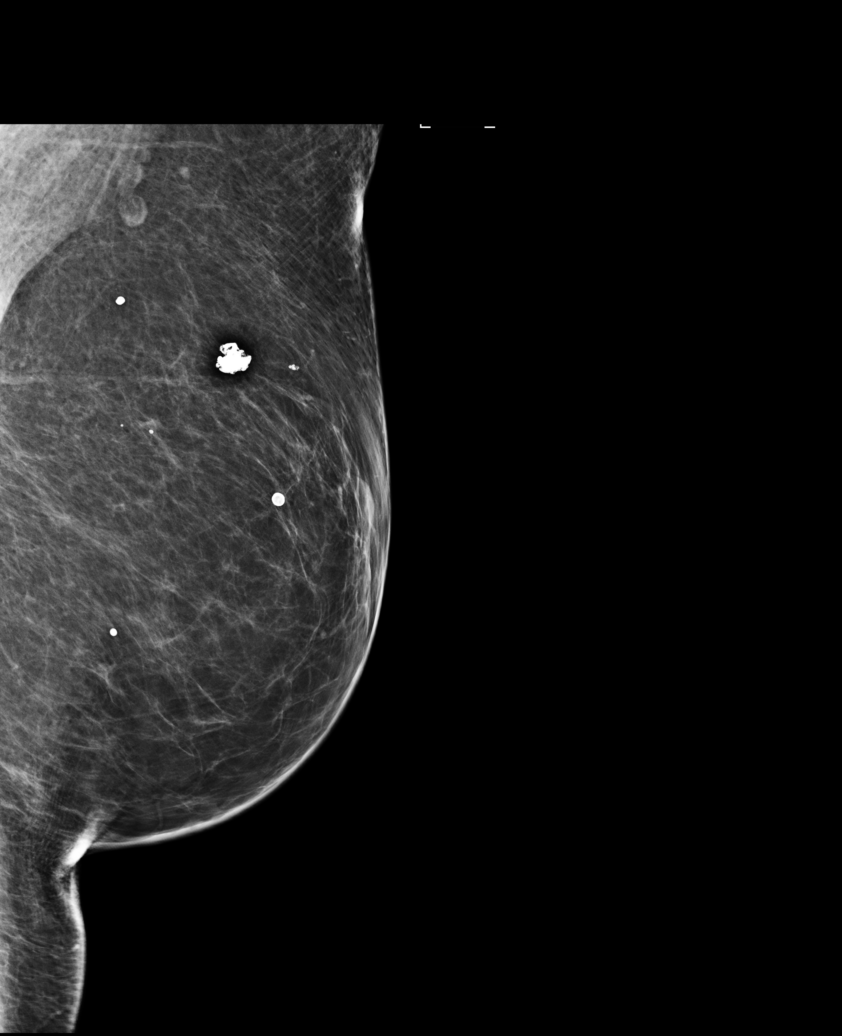

[5 of 5 positions shown; findings below may reference images not displayed]

ACR Breast Density Category b: There are scattered areas of
fibroglandular density.
FINDINGS: There are no findings suspicious for malignancy. Images were
processed with CAD.
IMPRESSION: No mammographic evidence of malignancy. A result letter of this
screening mammogram will be mailed directly to the patient.

RECOMMENDATION:
Screening mammogram in one year. (Code:2Y-Y-1CJ)

BI-RADS CATEGORY  1: Negative.

## 2018-07-27 ENCOUNTER — Ambulatory Visit (INDEPENDENT_AMBULATORY_CARE_PROVIDER_SITE_OTHER): Payer: PPO

## 2018-07-27 ENCOUNTER — Other Ambulatory Visit: Payer: Self-pay

## 2018-07-27 ENCOUNTER — Ambulatory Visit (INDEPENDENT_AMBULATORY_CARE_PROVIDER_SITE_OTHER): Payer: PPO | Admitting: Cardiology

## 2018-07-27 ENCOUNTER — Encounter: Payer: Self-pay | Admitting: Cardiology

## 2018-07-27 VITALS — BP 146/76 | HR 61 | Ht 64.0 in | Wt 146.0 lb

## 2018-07-27 DIAGNOSIS — M79605 Pain in left leg: Secondary | ICD-10-CM | POA: Diagnosis not present

## 2018-07-27 DIAGNOSIS — M79604 Pain in right leg: Secondary | ICD-10-CM | POA: Diagnosis not present

## 2018-07-27 DIAGNOSIS — R079 Chest pain, unspecified: Secondary | ICD-10-CM | POA: Diagnosis not present

## 2018-07-27 DIAGNOSIS — R600 Localized edema: Secondary | ICD-10-CM | POA: Diagnosis not present

## 2018-07-27 DIAGNOSIS — I10 Essential (primary) hypertension: Secondary | ICD-10-CM

## 2018-07-27 DIAGNOSIS — R0609 Other forms of dyspnea: Secondary | ICD-10-CM

## 2018-07-27 NOTE — Progress Notes (Signed)
Patient is here for follow up visit.  Subjective:   @Patient  ID: Angel Day, female    DOB: [birth year], 78 y.o.   MRN: [id number]  Chief Complaint  Patient presents with  . Hypertension  . Follow-up   Please note that the patient was seen and note was done by me.   HPI   78 y/o Caucasian female with hypertension, hyperlipidemia, GERD, hip arthritis, with recurrent restorsternal chest pain.  Patient underwent workup for her symptoms of chest pain and shortness of breath. CTA showed mild nonobstructive CAD. Echocardiogram showed structurally normal heart, grade 1 DD, mild MR, mild-mod TR. ABI was low normal in left, normal in right leg.   Patient's previous workup showed abnormal stress EKG without any SPECT evidence of ischemia or infarction.  Patient is continued to have exertional chest heaviness.  Recently, she is also noticed intermittent leg swelling and bilateral leg pain on exertion. Patient is concerned given her family h/o CAD.    Past Medical History:  Diagnosis Date  . Depression   . Diabetes ([place])   . High cholesterol   . Hypertension      Past Surgical History:  Procedure Laterality Date  . BREAST REDUCTION SURGERY  1980  . REDUCTION MAMMAPLASTY Bilateral   . REFRACTIVE SURGERY  1991  . VAGINAL HYSTERECTOMY  1992     Social History   Socioeconomic History  . Marital status: Single    Spouse name: Not on file  . Number of children: 3  . Years of education: 21  . Highest education level: Not on file  Occupational History  . Occupation: Retired- Financial controller  . Financial resource strain: Not on file  . Food insecurity:    Worry: Not on file    Inability: Not on file  . Transportation needs:    Medical: Not on file    Non-medical: Not on file  Tobacco Use  . Smoking status: Former Smoker    Last attempt to quit: 03/02/1975    Years since quitting: 43.4  . Smokeless tobacco: Never Used  Substance and Sexual Activity  . Alcohol  use: No    Alcohol/week: 0.0 standard drinks  . Drug use: No  . Sexual activity: Not on file  Lifestyle  . Physical activity:    Days per week: Not on file    Minutes per session: Not on file  . Stress: Not on file  Relationships  . Social connections:    Talks on phone: Not on file    Gets together: Not on file    Attends religious service: Not on file    Active member of club or organization: Not on file    Attends meetings of clubs or organizations: Not on file    Relationship status: Not on file  . Intimate partner violence:    Fear of current or ex partner: Not on file    Emotionally abused: Not on file    Physically abused: Not on file    Forced sexual activity: Not on file  Other Topics Concern  . Not on file  Social History Narrative   Lives alone     Current Outpatient Medications on File Prior to Visit  Medication Sig Dispense Refill  . amLODipine (NORVASC) 5 MG tablet Take 1 tablet by mouth daily.    Marland Kitchen aspirin 81 MG tablet Take 81 mg by mouth daily.    . Azelastine HCl 0.15 % SOLN Place 2 sprays into both  nostrils as needed.    . Cholecalciferol (VITAMIN D3) 5000 units CAPS Take 5,000 Units by mouth 2 (two) times a week.     . hydrochlorothiazide (HYDRODIURIL) 25 MG tablet Take 1 tablet by mouth daily.    Marland Kitchen levothyroxine (SYNTHROID, LEVOTHROID) 50 MCG tablet Take 50 mcg by mouth daily.  5  . losartan (COZAAR) 100 MG tablet Take 100 mg by mouth daily.  10  . metFORMIN (GLUCOPHAGE) 500 MG tablet Take 500 mg by mouth 2 (two) times daily with a meal.   3  . rosuvastatin (CRESTOR) 20 MG tablet TAKE 1 TABLET BY MOUTH EVERY DAY 90 tablet 0   No current facility-administered medications on file prior to visit.     Cardiovascular studies:  Echocardiogram 07/27/2018: Normal LV systolic function with EF 63%. Left ventricle cavity is normal in size. Normal global wall motion. Doppler evidence of grade I (impaired) diastolic dysfunction, normal LAP. Calculated EF 63%.  Left atrial cavity is mildly dilated. Mild (Grade I) mitral regurgitation. Mild to moderate tricuspid regurgitation. Estimated pulmonary artery systolic pressure 29 mmHg.  CTA cor 07/12/2018: Coronaries: Ca score 309 (73rd percentile) LM: Calcified plaque at the ostium. No significant stenosis. LAD: Mixed plaque proximal and mid LAD, mild (<50%) stenosis.  LCx: Mixed plaque in mid LCx, minimal stenosis. RCA: Mixed plaque proximal and mid RCA with minimal stenosis.  Pulmonary veins drain normally to the left atrium.  IMPRESSION: 1. Coronary artery calcium score 309 Agatston units. This places the patient in the 73rd percentile for age and gender, suggesting intermediate risk for future cardiac events. 2.  Nonobstructive coronary disease. 3. Aortic atherosclerosis. 4. Small hiatal hernia.   Exercise myoview stress 03/22/2018:  1. The patient performed treadmill exercise using Bruce protocol, completing 5:01 minutes. The patient completed an estimated workload of 7 METS, reaching 86% of the maximum predicted heart rate. Exercise capacity was low. Hemodynamic response was normal. Stress symptoms included exercise induced chest pain, relieved with rest.  Stress electrocardiogram was positive for ischemia, showing 1.5-2 mm upsloping ST depressions in leads II, III, aVF. ST-T changes returned to baseline 2 min into recovery. 2. The overall quality of the study is good. There is no evidence of abnormal lung activity. Stress and rest SPECT images demonstrate homogeneous tracer distribution throughout the myocardium. Gated SPECT imaging reveals normal myocardial thickening and wall motion. The left ventricular ejection fraction was normal (57%).   3. Intermediate risk study due to EKG changes and exercise induced chest pain. Clinical correlation recommended.  EKG 03/17/2018: Sinus rhythm 73 bpm. Normal axis. Normal conduction. Normal EKG.  Recent labs: 01/13/2018: Chol 206, TG 197, HDL 43,  LDL 124 Labs 01/12/2018: Glucose 124. Cr 0.94. EGFR 58/67. Sodium 141, potassium 5.2. Rest of the CMP normal. H/H 12/37 MCV 94. Platelets 302. Hemoglobin A1c 6.1% TSH normal   Review of Systems  Constitution: Negative for decreased appetite, malaise/fatigue, weight gain and weight loss.  HENT: Negative for congestion.   Eyes: Negative for visual disturbance.  Cardiovascular: Positive for chest pain, dyspnea on exertion and orthopnea. Negative for leg swelling, palpitations and syncope.  Respiratory: Negative for shortness of breath.   Endocrine: Negative for cold intolerance.  Hematologic/Lymphatic: Does not bruise/bleed easily.  Skin: Negative for itching and rash.  Musculoskeletal: Positive for joint pain (Left hip pain). Negative for myalgias.  Gastrointestinal: Negative for abdominal pain, nausea and vomiting.  Genitourinary: Negative for dysuria.  Neurological: Negative for dizziness and weakness.  Psychiatric/Behavioral: The patient is not nervous/anxious.  Anxiety   All other systems reviewed and are negative.      Objective:    Vitals:   07/27/18 1116  BP: (!) 146/76  Pulse: 61  SpO2: 98%     Physical Exam  Constitutional: She is oriented to person, place, and time. She appears well-developed and well-nourished. No distress.  HENT:  Head: Normocephalic and atraumatic.  Eyes: Pupils are equal, round, and reactive to light. Conjunctivae are normal.  Neck: No JVD present.  Cardiovascular: Normal rate and regular rhythm.  No murmur heard. Pulses:      Femoral pulses are 2+ on the right side and 2+ on the left side.      Popliteal pulses are 2+ on the right side and 2+ on the left side.       Dorsalis pedis pulses are 2+ on the right side and 1+ on the left side.       Posterior tibial pulses are 2+ on the right side and 2+ on the left side.  Pulmonary/Chest: Effort normal and breath sounds normal. She has no wheezes. She has no rales.  Abdominal: Soft. Bowel  sounds are normal. There is no rebound.  Musculoskeletal:        General: No edema.  Lymphadenopathy:    She has no cervical adenopathy.  Neurological: She is alert and oriented to person, place, and time. No cranial nerve deficit.  Skin: Skin is warm and dry.  Psychiatric: She has a normal mood and affect.  Nursing note and vitals reviewed.       Assessment & Recommendations:    78 y/o Caucasian female with hypertension, hyperlipidemia, GERD, hip arthritis, with recurrent restorsternal chest pain.  Chest pain: Likely noncardiac. Reassuring CTA showing only mild nobobstructive CAD. Continue Aspirin/statin. I suspect her shortness of breath could be due to deconditioning.   Leg pain: Unlikely to be claudication given near normal ABI with normal waveform.   Hypertension: Elevated today, but reportedly normal at baseline. I have not made any change to her medications.   I will see her back in 6 weeks.    Please note that the patient was seen and note was done by me.   Nigel Mormon, MD [facility] Pager: [phone] Office: [phone] If no answer Cell [phone]

## 2018-09-19 DIAGNOSIS — H43393 Other vitreous opacities, bilateral: Secondary | ICD-10-CM | POA: Diagnosis not present

## 2018-09-19 DIAGNOSIS — H524 Presbyopia: Secondary | ICD-10-CM | POA: Diagnosis not present

## 2018-09-20 ENCOUNTER — Other Ambulatory Visit: Payer: Self-pay

## 2018-09-20 DIAGNOSIS — E785 Hyperlipidemia, unspecified: Secondary | ICD-10-CM

## 2018-09-20 MED ORDER — ROSUVASTATIN CALCIUM 20 MG PO TABS: 20.0000 mg | ORAL_TABLET | Freq: Every day | ORAL | 0 refills | Status: DC

## 2018-09-27 ENCOUNTER — Other Ambulatory Visit: Payer: Self-pay

## 2018-10-09 DIAGNOSIS — F329 Major depressive disorder, single episode, unspecified: Secondary | ICD-10-CM | POA: Diagnosis not present

## 2018-10-17 DIAGNOSIS — F329 Major depressive disorder, single episode, unspecified: Secondary | ICD-10-CM | POA: Diagnosis not present

## 2018-10-24 DIAGNOSIS — F329 Major depressive disorder, single episode, unspecified: Secondary | ICD-10-CM | POA: Diagnosis not present

## 2018-11-14 DIAGNOSIS — F329 Major depressive disorder, single episode, unspecified: Secondary | ICD-10-CM | POA: Diagnosis not present

## 2018-11-15 DIAGNOSIS — E785 Hyperlipidemia, unspecified: Secondary | ICD-10-CM | POA: Diagnosis not present

## 2018-11-15 DIAGNOSIS — I10 Essential (primary) hypertension: Secondary | ICD-10-CM | POA: Diagnosis not present

## 2018-11-15 DIAGNOSIS — E118 Type 2 diabetes mellitus with unspecified complications: Secondary | ICD-10-CM | POA: Diagnosis not present

## 2018-11-15 DIAGNOSIS — E039 Hypothyroidism, unspecified: Secondary | ICD-10-CM | POA: Diagnosis not present

## 2018-11-22 DIAGNOSIS — E119 Type 2 diabetes mellitus without complications: Secondary | ICD-10-CM | POA: Diagnosis not present

## 2018-11-22 DIAGNOSIS — Z23 Encounter for immunization: Secondary | ICD-10-CM | POA: Diagnosis not present

## 2018-11-22 DIAGNOSIS — Z658 Other specified problems related to psychosocial circumstances: Secondary | ICD-10-CM | POA: Diagnosis not present

## 2018-11-22 DIAGNOSIS — E039 Hypothyroidism, unspecified: Secondary | ICD-10-CM | POA: Diagnosis not present

## 2018-11-22 DIAGNOSIS — E785 Hyperlipidemia, unspecified: Secondary | ICD-10-CM | POA: Diagnosis not present

## 2018-11-22 DIAGNOSIS — Z7189 Other specified counseling: Secondary | ICD-10-CM | POA: Diagnosis not present

## 2018-11-22 DIAGNOSIS — I10 Essential (primary) hypertension: Secondary | ICD-10-CM | POA: Diagnosis not present

## 2018-12-23 ENCOUNTER — Other Ambulatory Visit: Payer: Self-pay | Admitting: Cardiology

## 2018-12-23 DIAGNOSIS — E785 Hyperlipidemia, unspecified: Secondary | ICD-10-CM

## 2018-12-28 DIAGNOSIS — F329 Major depressive disorder, single episode, unspecified: Secondary | ICD-10-CM | POA: Diagnosis not present

## 2019-01-08 ENCOUNTER — Ambulatory Visit: Payer: PPO | Admitting: Cardiology

## 2019-02-02 ENCOUNTER — Encounter: Payer: Self-pay | Admitting: Cardiology

## 2019-02-02 ENCOUNTER — Telehealth: Payer: PPO | Admitting: Cardiology

## 2019-02-02 ENCOUNTER — Other Ambulatory Visit: Payer: Self-pay

## 2019-02-02 VITALS — Ht 64.0 in | Wt 146.0 lb

## 2019-02-02 DIAGNOSIS — I2510 Atherosclerotic heart disease of native coronary artery without angina pectoris: Secondary | ICD-10-CM | POA: Diagnosis not present

## 2019-02-02 DIAGNOSIS — I10 Essential (primary) hypertension: Secondary | ICD-10-CM

## 2019-02-02 DIAGNOSIS — E782 Mixed hyperlipidemia: Secondary | ICD-10-CM | POA: Insufficient documentation

## 2019-02-02 NOTE — Progress Notes (Signed)
Patient is here for follow up visit.  Subjective:   @Patient  ID: Angel Day, female    DOB: 01/19/1941, 78 y.o.   MRN: [id number]   I connected with the patient on 02/02/2019 by a telephone call and verified that I am speaking with the correct person using two identifiers.     I offered the patient a video enabled application for a virtual visit. Unfortunately, this could not be accomplished due to technical difficulties/lack of video enabled phone/computer. I discussed the limitations of evaluation and management by telemedicine and the availability of in person appointments. The patient expressed understanding and agreed to proceed.   This visit type was conducted due to national recommendations for restrictions regarding the COVID-19 Pandemic (e.g. social distancing).  This format is felt to be most appropriate for this patient at this time.  All issues noted in this document were discussed and addressed.  No physical exam was performed (except for noted visual exam findings with Tele health visits).  The patient has consented to conduct a Tele health visit and understands insurance will be billed.   Chief Complaint  Patient presents with  . Hypertension    HPI  78 y/o Caucasian female with hypertension, hyperlipidemia, mild nonobstructive CAD  She is doing well, denies any chest pain. She has shortness of breath, only when she walks with the mask on. BO was controlled, when seen by PCP.  Past Medical History:  Diagnosis Date  . Depression   . Diabetes ([place])   . High cholesterol   . Hypertension      Past Surgical History:  Procedure Laterality Date  . BREAST REDUCTION SURGERY  1980  . REDUCTION MAMMAPLASTY Bilateral   . REFRACTIVE SURGERY  1991  . VAGINAL HYSTERECTOMY  1992     Social History   Socioeconomic History  . Marital status: Single    Spouse name: Not on file  . Number of children: 3  . Years of education: 74  . Highest education level: Not on  file  Occupational History  . Occupation: Retired- Financial controller  . Financial resource strain: Not on file  . Food insecurity    Worry: Not on file    Inability: Not on file  . Transportation needs    Medical: Not on file    Non-medical: Not on file  Tobacco Use  . Smoking status: Former Smoker    Quit date: 03/02/1975    Years since quitting: 43.9  . Smokeless tobacco: Never Used  Substance and Sexual Activity  . Alcohol use: No    Alcohol/week: 0.0 standard drinks  . Drug use: No  . Sexual activity: Not on file  Lifestyle  . Physical activity    Days per week: Not on file    Minutes per session: Not on file  . Stress: Not on file  Relationships  . Social Herbalist on phone: Not on file    Gets together: Not on file    Attends religious service: Not on file    Active member of club or organization: Not on file    Attends meetings of clubs or organizations: Not on file    Relationship status: Not on file  . Intimate partner violence    Fear of current or ex partner: Not on file    Emotionally abused: Not on file    Physically abused: Not on file    Forced sexual activity: Not on file  Other  Topics Concern  . Not on file  Social History Narrative   Lives alone     Current Outpatient Medications on File Prior to Visit  Medication Sig Dispense Refill  . amLODipine (NORVASC) 5 MG tablet Take 1 tablet by mouth daily.    Marland Kitchen aspirin 81 MG tablet Take 81 mg by mouth daily.    . Cholecalciferol (VITAMIN D3) 5000 units CAPS Take 5,000 Units by mouth 2 (two) times a week.     . hydrochlorothiazide (HYDRODIURIL) 25 MG tablet Take 1 tablet by mouth daily.    Marland Kitchen levothyroxine (SYNTHROID, LEVOTHROID) 50 MCG tablet Take 50 mcg by mouth daily.  5  . losartan (COZAAR) 100 MG tablet Take 100 mg by mouth daily.  10  . metFORMIN (GLUCOPHAGE) 500 MG tablet Take 500 mg by mouth 2 (two) times daily with a meal.   3  . rosuvastatin (CRESTOR) 20 MG tablet TAKE 1 TABLET BY  MOUTH EVERY DAY 90 tablet 0   No current facility-administered medications on file prior to visit.     Cardiovascular studies:  Echocardiogram 07/27/2018: Normal LV systolic function with EF 63%. Left ventricle cavity is normal in size. Normal global wall motion. Doppler evidence of grade I (impaired) diastolic dysfunction, normal LAP. Calculated EF 63%. Left atrial cavity is mildly dilated. Mild (Grade I) mitral regurgitation. Mild to moderate tricuspid regurgitation. Estimated pulmonary artery systolic pressure 29 mmHg.  CTA cor 07/12/2018: Coronaries: Ca score 309 (73rd percentile) LM: Calcified plaque at the ostium. No significant stenosis. LAD: Mixed plaque proximal and mid LAD, mild (<50%) stenosis.  LCx: Mixed plaque in mid LCx, minimal stenosis. RCA: Mixed plaque proximal and mid RCA with minimal stenosis.  Pulmonary veins drain normally to the left atrium.  IMPRESSION: 1. Coronary artery calcium score 309 Agatston units. This places the patient in the 73rd percentile for age and gender, suggesting intermediate risk for future cardiac events. 2.  Nonobstructive coronary disease. 3. Aortic atherosclerosis. 4. Small hiatal hernia.   Exercise myoview stress 03/22/2018:  1. The patient performed treadmill exercise using Bruce protocol, completing 5:01 minutes. The patient completed an estimated workload of 7 METS, reaching 86% of the maximum predicted heart rate. Exercise capacity was low. Hemodynamic response was normal. Stress symptoms included exercise induced chest pain, relieved with rest.  Stress electrocardiogram was positive for ischemia, showing 1.5-2 mm upsloping ST depressions in leads II, III, aVF. ST-T changes returned to baseline 2 min into recovery. 2. The overall quality of the study is good. There is no evidence of abnormal lung activity. Stress and rest SPECT images demonstrate homogeneous tracer distribution throughout the myocardium. Gated SPECT imaging  reveals normal myocardial thickening and wall motion. The left ventricular ejection fraction was normal (57%).   3. Intermediate risk study due to EKG changes and exercise induced chest pain. Clinical correlation recommended.  EKG 03/17/2018: Sinus rhythm 73 bpm. Normal axis. Normal conduction. Normal EKG.  Recent labs: 01/13/2018: Chol 206, TG 197, HDL 43, LDL 124 Labs 01/12/2018: Glucose 124. Cr 0.94. EGFR 58/67. Sodium 141, potassium 5.2. Rest of the CMP normal. H/H 12/37 MCV 94. Platelets 302. Hemoglobin A1c 6.1% TSH normal   Review of Systems  Constitution: Negative for decreased appetite, malaise/fatigue, weight gain and weight loss.  HENT: Negative for congestion.   Eyes: Negative for visual disturbance.  Cardiovascular: Positive for chest pain, dyspnea on exertion and orthopnea. Negative for leg swelling, palpitations and syncope.  Respiratory: Negative for shortness of breath.   Endocrine: Negative for cold  intolerance.  Hematologic/Lymphatic: Does not bruise/bleed easily.  Skin: Negative for itching and rash.  Musculoskeletal: Positive for joint pain (Left hip pain). Negative for myalgias.  Gastrointestinal: Negative for abdominal pain, nausea and vomiting.  Genitourinary: Negative for dysuria.  Neurological: Negative for dizziness and weakness.  Psychiatric/Behavioral: The patient is not nervous/anxious.        Anxiety   All other systems reviewed and are negative.      Objective:    Vitals not available.  Physical Exam Not performed. Telephone visit.      Assessment & Recommendations:    78 y/o Caucasian female with hypertension, hyperlipidemia, GERD  Mild nonobstructive CAD: Continue Aspirin, rosuvastatin 20 mg.   Leg pain: Unlikely to be claudication given near normal ABI with normal waveform.   F/u in 6 months   Angel Ventura [name], MD [facility] [place] Pager: [phone] Office: [phone] If no answer Cell [phone]

## 2019-03-16 ENCOUNTER — Ambulatory Visit: Payer: Medicare Other | Attending: Internal Medicine

## 2019-03-16 DIAGNOSIS — Z23 Encounter for immunization: Secondary | ICD-10-CM

## 2019-03-16 NOTE — Progress Notes (Signed)
   Covid-19 Vaccination Clinic  Name:  Angel Day    MRN: [id number] DOB: [birth year]  03/16/2019  [name] was observed post Covid-19 immunization for 15 minutes without incidence. She was provided with Vaccine Information Sheet and instruction to access the V-Safe system.   [name] was instructed to call 911 with any severe reactions post vaccine: Marland Kitchen Difficulty breathing  . Swelling of your face and throat  . A fast heartbeat  . A bad rash all over your body  . Dizziness and weakness    Immunizations Administered    Name Date Dose VIS Date Route   Pfizer COVID-19 Vaccine 03/16/2019  9:17 AM 0.3 mL 02/09/2019 Intramuscular   Manufacturer: [employer], [employer]   Lot: [id number]   NDC: [address]-1

## 2019-03-21 DIAGNOSIS — Z8619 Personal history of other infectious and parasitic diseases: Secondary | ICD-10-CM | POA: Diagnosis not present

## 2019-03-21 DIAGNOSIS — R195 Other fecal abnormalities: Secondary | ICD-10-CM | POA: Diagnosis not present

## 2019-03-21 DIAGNOSIS — E118 Type 2 diabetes mellitus with unspecified complications: Secondary | ICD-10-CM | POA: Diagnosis not present

## 2019-03-21 DIAGNOSIS — E7841 Elevated Lipoprotein(a): Secondary | ICD-10-CM | POA: Diagnosis not present

## 2019-03-21 DIAGNOSIS — E039 Hypothyroidism, unspecified: Secondary | ICD-10-CM | POA: Diagnosis not present

## 2019-03-28 DIAGNOSIS — R195 Other fecal abnormalities: Secondary | ICD-10-CM | POA: Diagnosis not present

## 2019-03-28 DIAGNOSIS — E118 Type 2 diabetes mellitus with unspecified complications: Secondary | ICD-10-CM | POA: Diagnosis not present

## 2019-03-28 DIAGNOSIS — E039 Hypothyroidism, unspecified: Secondary | ICD-10-CM | POA: Diagnosis not present

## 2019-03-28 DIAGNOSIS — N390 Urinary tract infection, site not specified: Secondary | ICD-10-CM | POA: Diagnosis not present

## 2019-03-28 DIAGNOSIS — Z7189 Other specified counseling: Secondary | ICD-10-CM | POA: Diagnosis not present

## 2019-03-28 DIAGNOSIS — E7841 Elevated Lipoprotein(a): Secondary | ICD-10-CM | POA: Diagnosis not present

## 2019-03-28 DIAGNOSIS — E119 Type 2 diabetes mellitus without complications: Secondary | ICD-10-CM | POA: Diagnosis not present

## 2019-03-28 DIAGNOSIS — I10 Essential (primary) hypertension: Secondary | ICD-10-CM | POA: Diagnosis not present

## 2019-04-03 ENCOUNTER — Ambulatory Visit: Payer: PPO | Attending: Internal Medicine

## 2019-04-03 ENCOUNTER — Ambulatory Visit: Payer: PPO

## 2019-04-03 DIAGNOSIS — Z23 Encounter for immunization: Secondary | ICD-10-CM | POA: Insufficient documentation

## 2019-04-03 NOTE — Progress Notes (Signed)
   Covid-19 Vaccination Clinic  Name:  BRYA SIMERLY    MRN: [id number] DOB: 11/03/1940  04/03/2019  Ms. Strength was observed post Covid-19 immunization for 15 minutes without incidence. She was provided with Vaccine Information Sheet and instruction to access the V-Safe system.   Ms. Hauser was instructed to call 911 with any severe reactions post vaccine: Marland Kitchen Difficulty breathing  . Swelling of your face and throat  . A fast heartbeat  . A bad rash all over your body  . Dizziness and weakness    Immunizations Administered    Name Date Dose VIS Date Route   Pfizer COVID-19 Vaccine 04/03/2019  8:22 AM 0.3 mL 02/09/2019 Intramuscular   Manufacturer: [employer], [employer]   Lot: [id number]   NDC: [address]-9

## 2019-04-08 ENCOUNTER — Other Ambulatory Visit: Payer: Self-pay | Admitting: Cardiology

## 2019-04-08 DIAGNOSIS — E785 Hyperlipidemia, unspecified: Secondary | ICD-10-CM

## 2019-04-26 ENCOUNTER — Other Ambulatory Visit: Payer: Self-pay | Admitting: Cardiology

## 2019-04-26 DIAGNOSIS — I10 Essential (primary) hypertension: Secondary | ICD-10-CM

## 2019-05-02 DIAGNOSIS — M67911 Unspecified disorder of synovium and tendon, right shoulder: Secondary | ICD-10-CM | POA: Diagnosis not present

## 2019-05-02 DIAGNOSIS — M67912 Unspecified disorder of synovium and tendon, left shoulder: Secondary | ICD-10-CM | POA: Diagnosis not present

## 2019-05-04 DIAGNOSIS — M7542 Impingement syndrome of left shoulder: Secondary | ICD-10-CM | POA: Diagnosis not present

## 2019-05-04 DIAGNOSIS — M7541 Impingement syndrome of right shoulder: Secondary | ICD-10-CM | POA: Diagnosis not present

## 2019-05-04 DIAGNOSIS — M25611 Stiffness of right shoulder, not elsewhere classified: Secondary | ICD-10-CM | POA: Diagnosis not present

## 2019-05-04 DIAGNOSIS — M6281 Muscle weakness (generalized): Secondary | ICD-10-CM | POA: Diagnosis not present

## 2019-05-08 DIAGNOSIS — M25611 Stiffness of right shoulder, not elsewhere classified: Secondary | ICD-10-CM | POA: Diagnosis not present

## 2019-05-08 DIAGNOSIS — M6281 Muscle weakness (generalized): Secondary | ICD-10-CM | POA: Diagnosis not present

## 2019-05-08 DIAGNOSIS — M7541 Impingement syndrome of right shoulder: Secondary | ICD-10-CM | POA: Diagnosis not present

## 2019-05-08 DIAGNOSIS — M7542 Impingement syndrome of left shoulder: Secondary | ICD-10-CM | POA: Diagnosis not present

## 2019-05-10 DIAGNOSIS — M7541 Impingement syndrome of right shoulder: Secondary | ICD-10-CM | POA: Diagnosis not present

## 2019-05-10 DIAGNOSIS — M25611 Stiffness of right shoulder, not elsewhere classified: Secondary | ICD-10-CM | POA: Diagnosis not present

## 2019-05-10 DIAGNOSIS — M6281 Muscle weakness (generalized): Secondary | ICD-10-CM | POA: Diagnosis not present

## 2019-05-10 DIAGNOSIS — M7542 Impingement syndrome of left shoulder: Secondary | ICD-10-CM | POA: Diagnosis not present

## 2019-05-15 DIAGNOSIS — M6281 Muscle weakness (generalized): Secondary | ICD-10-CM | POA: Diagnosis not present

## 2019-05-15 DIAGNOSIS — M25611 Stiffness of right shoulder, not elsewhere classified: Secondary | ICD-10-CM | POA: Diagnosis not present

## 2019-05-15 DIAGNOSIS — M7542 Impingement syndrome of left shoulder: Secondary | ICD-10-CM | POA: Diagnosis not present

## 2019-05-15 DIAGNOSIS — M7541 Impingement syndrome of right shoulder: Secondary | ICD-10-CM | POA: Diagnosis not present

## 2019-05-17 DIAGNOSIS — M7541 Impingement syndrome of right shoulder: Secondary | ICD-10-CM | POA: Diagnosis not present

## 2019-05-17 DIAGNOSIS — M7542 Impingement syndrome of left shoulder: Secondary | ICD-10-CM | POA: Diagnosis not present

## 2019-05-17 DIAGNOSIS — M25611 Stiffness of right shoulder, not elsewhere classified: Secondary | ICD-10-CM | POA: Diagnosis not present

## 2019-05-17 DIAGNOSIS — M6281 Muscle weakness (generalized): Secondary | ICD-10-CM | POA: Diagnosis not present

## 2019-05-22 DIAGNOSIS — M6281 Muscle weakness (generalized): Secondary | ICD-10-CM | POA: Diagnosis not present

## 2019-05-22 DIAGNOSIS — M7542 Impingement syndrome of left shoulder: Secondary | ICD-10-CM | POA: Diagnosis not present

## 2019-05-22 DIAGNOSIS — M7541 Impingement syndrome of right shoulder: Secondary | ICD-10-CM | POA: Diagnosis not present

## 2019-05-22 DIAGNOSIS — M25611 Stiffness of right shoulder, not elsewhere classified: Secondary | ICD-10-CM | POA: Diagnosis not present

## 2019-05-24 DIAGNOSIS — M6281 Muscle weakness (generalized): Secondary | ICD-10-CM | POA: Diagnosis not present

## 2019-05-24 DIAGNOSIS — M25611 Stiffness of right shoulder, not elsewhere classified: Secondary | ICD-10-CM | POA: Diagnosis not present

## 2019-05-24 DIAGNOSIS — M7542 Impingement syndrome of left shoulder: Secondary | ICD-10-CM | POA: Diagnosis not present

## 2019-05-24 DIAGNOSIS — M7541 Impingement syndrome of right shoulder: Secondary | ICD-10-CM | POA: Diagnosis not present

## 2019-05-29 DIAGNOSIS — M7542 Impingement syndrome of left shoulder: Secondary | ICD-10-CM | POA: Diagnosis not present

## 2019-05-29 DIAGNOSIS — M25611 Stiffness of right shoulder, not elsewhere classified: Secondary | ICD-10-CM | POA: Diagnosis not present

## 2019-05-29 DIAGNOSIS — M6281 Muscle weakness (generalized): Secondary | ICD-10-CM | POA: Diagnosis not present

## 2019-05-29 DIAGNOSIS — M7541 Impingement syndrome of right shoulder: Secondary | ICD-10-CM | POA: Diagnosis not present

## 2019-05-30 DIAGNOSIS — M67911 Unspecified disorder of synovium and tendon, right shoulder: Secondary | ICD-10-CM | POA: Diagnosis not present

## 2019-06-14 DIAGNOSIS — M25511 Pain in right shoulder: Secondary | ICD-10-CM | POA: Diagnosis not present

## 2019-06-27 DIAGNOSIS — M75111 Incomplete rotator cuff tear or rupture of right shoulder, not specified as traumatic: Secondary | ICD-10-CM | POA: Diagnosis not present

## 2019-06-27 DIAGNOSIS — I10 Essential (primary) hypertension: Secondary | ICD-10-CM | POA: Diagnosis not present

## 2019-06-27 DIAGNOSIS — E119 Type 2 diabetes mellitus without complications: Secondary | ICD-10-CM | POA: Diagnosis not present

## 2019-06-27 DIAGNOSIS — E039 Hypothyroidism, unspecified: Secondary | ICD-10-CM | POA: Diagnosis not present

## 2019-06-27 DIAGNOSIS — E7841 Elevated Lipoprotein(a): Secondary | ICD-10-CM | POA: Diagnosis not present

## 2019-07-04 DIAGNOSIS — E785 Hyperlipidemia, unspecified: Secondary | ICD-10-CM | POA: Diagnosis not present

## 2019-07-04 DIAGNOSIS — M5136 Other intervertebral disc degeneration, lumbar region: Secondary | ICD-10-CM | POA: Diagnosis not present

## 2019-07-04 DIAGNOSIS — E039 Hypothyroidism, unspecified: Secondary | ICD-10-CM | POA: Diagnosis not present

## 2019-07-04 DIAGNOSIS — M19011 Primary osteoarthritis, right shoulder: Secondary | ICD-10-CM | POA: Diagnosis not present

## 2019-07-04 DIAGNOSIS — I10 Essential (primary) hypertension: Secondary | ICD-10-CM | POA: Diagnosis not present

## 2019-07-04 DIAGNOSIS — Z0000 Encounter for general adult medical examination without abnormal findings: Secondary | ICD-10-CM | POA: Diagnosis not present

## 2019-07-04 DIAGNOSIS — E119 Type 2 diabetes mellitus without complications: Secondary | ICD-10-CM | POA: Diagnosis not present

## 2019-08-03 ENCOUNTER — Ambulatory Visit: Payer: PPO | Admitting: Cardiology

## 2019-08-22 ENCOUNTER — Other Ambulatory Visit: Payer: Self-pay

## 2019-08-22 ENCOUNTER — Ambulatory Visit: Payer: PPO | Admitting: Cardiology

## 2019-08-22 ENCOUNTER — Encounter: Payer: Self-pay | Admitting: Cardiology

## 2019-08-22 VITALS — BP 144/66 | HR 71 | Resp 16 | Ht 64.0 in | Wt 152.0 lb

## 2019-08-22 DIAGNOSIS — I25118 Atherosclerotic heart disease of native coronary artery with other forms of angina pectoris: Secondary | ICD-10-CM | POA: Insufficient documentation

## 2019-08-22 DIAGNOSIS — I2510 Atherosclerotic heart disease of native coronary artery without angina pectoris: Secondary | ICD-10-CM | POA: Diagnosis not present

## 2019-08-22 DIAGNOSIS — I10 Essential (primary) hypertension: Secondary | ICD-10-CM

## 2019-08-22 DIAGNOSIS — R0609 Other forms of dyspnea: Secondary | ICD-10-CM | POA: Diagnosis not present

## 2019-08-22 DIAGNOSIS — E782 Mixed hyperlipidemia: Secondary | ICD-10-CM

## 2019-08-22 NOTE — Progress Notes (Signed)
Patient is here for follow up visit.  Subjective:   @Patient  ID: Angel Day, female    DOB: 02-02-1941, 79 y.o.   MRN: [id number]   Chief Complaint  Patient presents with   Non-obstructive CAD   Hyperlipidemia   Follow-up    78 month    HPI  79 y/o Caucasian female with hypertension, hyperlipidemia, prediabetes, mild nonobstructive CAD  She denies any chest pain. Blood pressure is well controlled. LDL has improved. However, she has noticed that she has to stop more often while doing her usual walk around the park due to shortness of breath. Separately, she has had worsening arm and back pain, but she is trying to delay the surgery if possible.   Blood pressure elevated today. She reports family stressors and states blood pressure is usually much lower. She has upcoming office visit with her PCP.   On a separate note, she has come off metformin due to GI side effects. A1C is 6.4%.   Current Outpatient Medications on File Prior to Visit  Medication Sig Dispense Refill   amLODipine (NORVASC) 5 MG tablet TAKE 1 TABLET BY MOUTH EVERY DAY 90 tablet 3   aspirin 81 MG tablet Take 81 mg by mouth. Sometimes     Cholecalciferol (VITAMIN D3) 5000 units CAPS Take 5,000 Units by mouth 2 (two) times a week.      hydrochlorothiazide (HYDRODIURIL) 25 MG tablet Take 1 tablet by mouth daily.     levothyroxine (SYNTHROID, LEVOTHROID) 50 MCG tablet Take 50 mcg by mouth daily.  5   losartan (COZAAR) 100 MG tablet Take 100 mg by mouth daily.  10   rosuvastatin (CRESTOR) 20 MG tablet TAKE 1 TABLET BY MOUTH EVERY DAY 90 tablet 0   traZODone (DESYREL) 50 MG tablet Take 50 mg by mouth at bedtime. 1/2-1 tablet at bedtime     No current facility-administered medications on file prior to visit.    Cardiovascular studies:  EKG 08/22/2019: Sinus rhythm 68 bpm Nonspecific ST-T changes  Echocardiogram 07/27/2018: Normal LV systolic function with EF 63%. Left ventricle cavity is normal  in size. Normal global wall motion. Doppler evidence of grade I (impaired) diastolic dysfunction, normal LAP. Calculated EF 63%. Left atrial cavity is mildly dilated. Mild (Grade I) mitral regurgitation. Mild to moderate tricuspid regurgitation. Estimated pulmonary artery systolic pressure 29 mmHg.  CTA cor 07/12/2018: Coronaries: Ca score 309 (73rd percentile) LM: Calcified plaque at the ostium. No significant stenosis. LAD: Mixed plaque proximal and mid LAD, mild (<50%) stenosis.  LCx: Mixed plaque in mid LCx, minimal stenosis. RCA: Mixed plaque proximal and mid RCA with minimal stenosis.  Pulmonary veins drain normally to the left atrium.  IMPRESSION: 1. Coronary artery calcium score 309 Agatston units. This places the patient in the 73rd percentile for age and gender, suggesting intermediate risk for future cardiac events. 2.  Nonobstructive coronary disease. 3. Aortic atherosclerosis. 4. Small hiatal hernia.   Exercise myoview stress 03/22/2018:  1. The patient performed treadmill exercise using Bruce protocol, completing 5:01 minutes. The patient completed an estimated workload of 7 METS, reaching 86% of the maximum predicted heart rate. Exercise capacity was low. Hemodynamic response was normal. Stress symptoms included exercise induced chest pain, relieved with rest.  Stress electrocardiogram was positive for ischemia, showing 1.5-2 mm upsloping ST depressions in leads II, III, aVF. ST-T changes returned to baseline 2 min into recovery. 2. The overall quality of the study is good. There is no evidence of abnormal lung  activity. Stress and rest SPECT images demonstrate homogeneous tracer distribution throughout the myocardium. Gated SPECT imaging reveals normal myocardial thickening and wall motion. The left ventricular ejection fraction was normal (57%).   3. Intermediate risk study due to EKG changes and exercise induced chest pain. Clinical correlation recommended.  EKG  03/17/2018: Sinus rhythm 73 bpm. Normal axis. Normal conduction. Normal EKG.  Recent labs: Jan-Apr 2021: Glucose 1054, BUN/Cr 24/1.0. EGFR 53. HbA1C 6.4% Chol 116, TG 145, HDL 41, LDL 43 TSH 2.6 normal  01/13/2018:  12/2017: Glucose 124, Cr 0.94. EGFR 58. Na/K 141/5.2. 12Rest of the CMP normal H/H 12/37. MCV 94. Platelets 302 HbA1C 6.1% Chol 206, TG 197, HDL 43, LDL 124   Review of Systems  Cardiovascular: Negative for chest pain, dyspnea on exertion, leg swelling, palpitations and syncope.        Objective:    Vitals:   08/22/19 1342  BP: (!) 144/66  Pulse: 71  Resp: 16  SpO2: 98%     Physical Exam Vitals and nursing note reviewed.  Constitutional:      General: She is not in acute distress. Neck:     Vascular: No JVD.  Cardiovascular:     Rate and Rhythm: Normal rate and regular rhythm.     Pulses: Intact distal pulses.     Heart sounds: Normal heart sounds. No murmur heard.   Pulmonary:     Effort: Pulmonary effort is normal.     Breath sounds: Normal breath sounds. No wheezing or rales.         Assessment & Recommendations:    79 y/o Caucasian female with hypertension, hyperlipidemia, prediabetes, mild nonobstructive CAD  Mild nonobstructive CAD: Continue Aspirin, rosuvastatin 20 mg.  In light of new exertional dyspnea, I will obtain exercise nuclear stress test. I do expect to see EKG changes like in 1.2020, that were false positive. I would look to see if she has any perfusion abnormalities.   Hypertension: Blood pressure elevated today. Recheck at next visit.   Hyperlipidemia: Excellent improvement in LDL, now 43.  F/u in 4 weeks  [place], MD [facility] [place] Pager: [phone] Office: [phone] If no answer Cell [phone]

## 2019-09-02 ENCOUNTER — Other Ambulatory Visit: Payer: Self-pay | Admitting: Cardiology

## 2019-09-02 DIAGNOSIS — E785 Hyperlipidemia, unspecified: Secondary | ICD-10-CM

## 2019-09-04 ENCOUNTER — Other Ambulatory Visit: Payer: PPO

## 2019-09-17 ENCOUNTER — Ambulatory Visit: Payer: PPO

## 2019-09-17 ENCOUNTER — Other Ambulatory Visit: Payer: Self-pay

## 2019-09-17 DIAGNOSIS — I2510 Atherosclerotic heart disease of native coronary artery without angina pectoris: Secondary | ICD-10-CM

## 2019-09-17 DIAGNOSIS — R0609 Other forms of dyspnea: Secondary | ICD-10-CM

## 2019-10-03 ENCOUNTER — Ambulatory Visit: Payer: PPO | Admitting: Cardiology

## 2019-10-10 ENCOUNTER — Other Ambulatory Visit: Payer: Self-pay

## 2019-10-10 ENCOUNTER — Encounter: Payer: Self-pay | Admitting: Cardiology

## 2019-10-10 ENCOUNTER — Ambulatory Visit: Payer: PPO | Admitting: Cardiology

## 2019-10-10 VITALS — BP 136/73 | HR 71 | Wt 151.0 lb

## 2019-10-10 DIAGNOSIS — I10 Essential (primary) hypertension: Secondary | ICD-10-CM | POA: Diagnosis not present

## 2019-10-10 DIAGNOSIS — E782 Mixed hyperlipidemia: Secondary | ICD-10-CM | POA: Diagnosis not present

## 2019-10-10 DIAGNOSIS — I2510 Atherosclerotic heart disease of native coronary artery without angina pectoris: Secondary | ICD-10-CM

## 2019-10-10 NOTE — Progress Notes (Signed)
Patient is here for follow up visit.  Subjective:   @Patient  ID: Angel Day, female    DOB: 07-18-1940, 79 y.o.   MRN: [id number]   Chief Complaint  Patient presents with  . Coronary Artery Disease    HPI  79 y/o Caucasian female with hypertension, hyperlipidemia, prediabetes, mild nonobstructive CAD  No chest pain. Has generalized fatigue, but no specific exertional dyspnea symptoms.   Current Outpatient Medications on File Prior to Visit  Medication Sig Dispense Refill  . amLODipine (NORVASC) 5 MG tablet TAKE 1 TABLET BY MOUTH EVERY DAY 90 tablet 3  . aspirin 81 MG tablet Take 81 mg by mouth. Sometimes    . Cholecalciferol (VITAMIN D3) 5000 units CAPS Take 5,000 Units by mouth 2 (two) times a week.     . hydrochlorothiazide (HYDRODIURIL) 25 MG tablet Take 1 tablet by mouth daily.    Marland Kitchen levothyroxine (SYNTHROID, LEVOTHROID) 50 MCG tablet Take 50 mcg by mouth daily.  5  . losartan (COZAAR) 100 MG tablet Take 100 mg by mouth daily.  10  . rosuvastatin (CRESTOR) 20 MG tablet TAKE 1 TABLET BY MOUTH EVERY DAY 90 tablet 0  . traZODone (DESYREL) 50 MG tablet Take 50 mg by mouth at bedtime. 1/2-1 tablet at bedtime     No current facility-administered medications on file prior to visit.    Cardiovascular studies:  Exercise Myoview stress test 09/17/2019: Normal myocardial perfusion. Stress LVEF 65%. Exercise nuclear stress test was performed using Bruce protocol. Patient reached 7 METS, and 85% of age predicted maximum heart rate. Exercise capacity was low. Chest pain not reported. Heart rate and hemodynamic response were normal. Stress EKG revealed no ischemic changes. Normal myocardial perfusion. Stress LVEF 65%. Low risk study,   EKG 08/22/2019: Sinus rhythm 68 bpm Nonspecific ST-T changes  Echocardiogram 07/27/2018: Normal LV systolic function with EF 63%. Left ventricle cavity is normal in size. Normal global wall motion. Doppler evidence of grade I (impaired)  diastolic dysfunction, normal LAP. Calculated EF 63%. Left atrial cavity is mildly dilated. Mild (Grade I) mitral regurgitation. Mild to moderate tricuspid regurgitation. Estimated pulmonary artery systolic pressure 29 mmHg.  CTA cor 07/12/2018: Coronaries: Ca score 309 (73rd percentile) LM: Calcified plaque at the ostium. No significant stenosis. LAD: Mixed plaque proximal and mid LAD, mild (<50%) stenosis.  LCx: Mixed plaque in mid LCx, minimal stenosis. RCA: Mixed plaque proximal and mid RCA with minimal stenosis.  Pulmonary veins drain normally to the left atrium.  IMPRESSION: 1. Coronary artery calcium score 309 Agatston units. This places the patient in the 73rd percentile for age and gender, suggesting intermediate risk for future cardiac events. 2.  Nonobstructive coronary disease. 3. Aortic atherosclerosis. 4. Small hiatal hernia.   Exercise myoview stress 03/22/2018:  1. The patient performed treadmill exercise using Bruce protocol, completing 5:01 minutes. The patient completed an estimated workload of 7 METS, reaching 86% of the maximum predicted heart rate. Exercise capacity was low. Hemodynamic response was normal. Stress symptoms included exercise induced chest pain, relieved with rest.  Stress electrocardiogram was positive for ischemia, showing 1.5-2 mm upsloping ST depressions in leads II, III, aVF. ST-T changes returned to baseline 2 min into recovery. 2. The overall quality of the study is good. There is no evidence of abnormal lung activity. Stress and rest SPECT images demonstrate homogeneous tracer distribution throughout the myocardium. Gated SPECT imaging reveals normal myocardial thickening and wall motion. The left ventricular ejection fraction was normal (57%).   3. Intermediate  risk study due to EKG changes and exercise induced chest pain. Clinical correlation recommended.  EKG 03/17/2018: Sinus rhythm 73 bpm. Normal axis. Normal conduction. Normal  EKG.  Recent labs: Jan-Apr 2021: Glucose 1054, BUN/Cr 24/1.0. EGFR 53. HbA1C 6.4% Chol 116, TG 145, HDL 41, LDL 43 TSH 2.6 normal  01/13/2018:  12/2017: Glucose 124, Cr 0.94. EGFR 58. Na/K 141/5.2. 12Rest of the CMP normal H/H 12/37. MCV 94. Platelets 302 HbA1C 6.1% Chol 206, TG 197, HDL 43, LDL 124   Review of Systems  Constitutional: Positive for malaise/fatigue.  Cardiovascular: Negative for chest pain, dyspnea on exertion, leg swelling, palpitations and syncope.        Objective:    Vitals:   10/10/19 1257  BP: 136/73  Pulse: 71  SpO2: 96%     Physical Exam Vitals and nursing note reviewed.  Constitutional:      General: She is not in acute distress. Neck:     Vascular: No JVD.  Cardiovascular:     Rate and Rhythm: Normal rate and regular rhythm.     Pulses: Intact distal pulses.     Heart sounds: Normal heart sounds. No murmur heard.   Pulmonary:     Effort: Pulmonary effort is normal.     Breath sounds: Normal breath sounds. No wheezing or rales.         Assessment & Recommendations:    79 y/o Caucasian female with hypertension, hyperlipidemia, prediabetes, mild nonobstructive CAD  Mild nonobstructive CAD: Continue Aspirin, rosuvastatin 20 mg.  No ischemia on stress tst (08/2019)  Hypertension: Well controlled  Hyperlipidemia: Excellent improvement in LDL, now 43.  F/u in 6 months  Angel Sill [name], MD [facility] [place] Pager: [phone] Office: [phone] If no answer Cell [phone]

## 2019-10-30 DIAGNOSIS — E039 Hypothyroidism, unspecified: Secondary | ICD-10-CM | POA: Diagnosis not present

## 2019-10-30 DIAGNOSIS — E119 Type 2 diabetes mellitus without complications: Secondary | ICD-10-CM | POA: Diagnosis not present

## 2019-10-30 DIAGNOSIS — M5136 Other intervertebral disc degeneration, lumbar region: Secondary | ICD-10-CM | POA: Diagnosis not present

## 2019-10-30 DIAGNOSIS — I10 Essential (primary) hypertension: Secondary | ICD-10-CM | POA: Diagnosis not present

## 2019-10-30 DIAGNOSIS — E785 Hyperlipidemia, unspecified: Secondary | ICD-10-CM | POA: Diagnosis not present

## 2019-11-06 DIAGNOSIS — R82998 Other abnormal findings in urine: Secondary | ICD-10-CM | POA: Diagnosis not present

## 2019-11-06 DIAGNOSIS — F419 Anxiety disorder, unspecified: Secondary | ICD-10-CM | POA: Diagnosis not present

## 2019-11-06 DIAGNOSIS — E119 Type 2 diabetes mellitus without complications: Secondary | ICD-10-CM | POA: Diagnosis not present

## 2019-11-06 DIAGNOSIS — I2510 Atherosclerotic heart disease of native coronary artery without angina pectoris: Secondary | ICD-10-CM | POA: Diagnosis not present

## 2019-11-06 DIAGNOSIS — E039 Hypothyroidism, unspecified: Secondary | ICD-10-CM | POA: Diagnosis not present

## 2019-11-06 DIAGNOSIS — F329 Major depressive disorder, single episode, unspecified: Secondary | ICD-10-CM | POA: Diagnosis not present

## 2019-11-06 DIAGNOSIS — E785 Hyperlipidemia, unspecified: Secondary | ICD-10-CM | POA: Diagnosis not present

## 2019-11-06 DIAGNOSIS — N183 Chronic kidney disease, stage 3 unspecified: Secondary | ICD-10-CM | POA: Diagnosis not present

## 2019-11-06 DIAGNOSIS — I10 Essential (primary) hypertension: Secondary | ICD-10-CM | POA: Diagnosis not present

## 2019-11-06 DIAGNOSIS — I129 Hypertensive chronic kidney disease with stage 1 through stage 4 chronic kidney disease, or unspecified chronic kidney disease: Secondary | ICD-10-CM | POA: Diagnosis not present

## 2019-11-06 DIAGNOSIS — M5136 Other intervertebral disc degeneration, lumbar region: Secondary | ICD-10-CM | POA: Diagnosis not present

## 2019-12-25 ENCOUNTER — Other Ambulatory Visit: Payer: Self-pay | Admitting: Cardiology

## 2019-12-25 DIAGNOSIS — E785 Hyperlipidemia, unspecified: Secondary | ICD-10-CM

## 2020-01-21 DIAGNOSIS — Z20822 Contact with and (suspected) exposure to covid-19: Secondary | ICD-10-CM | POA: Diagnosis not present

## 2020-02-06 DIAGNOSIS — E119 Type 2 diabetes mellitus without complications: Secondary | ICD-10-CM | POA: Diagnosis not present

## 2020-02-06 DIAGNOSIS — H43393 Other vitreous opacities, bilateral: Secondary | ICD-10-CM | POA: Diagnosis not present

## 2020-03-31 DIAGNOSIS — E785 Hyperlipidemia, unspecified: Secondary | ICD-10-CM | POA: Diagnosis not present

## 2020-03-31 DIAGNOSIS — E119 Type 2 diabetes mellitus without complications: Secondary | ICD-10-CM | POA: Diagnosis not present

## 2020-03-31 DIAGNOSIS — I129 Hypertensive chronic kidney disease with stage 1 through stage 4 chronic kidney disease, or unspecified chronic kidney disease: Secondary | ICD-10-CM | POA: Diagnosis not present

## 2020-03-31 DIAGNOSIS — E559 Vitamin D deficiency, unspecified: Secondary | ICD-10-CM | POA: Diagnosis not present

## 2020-03-31 DIAGNOSIS — Z87440 Personal history of urinary (tract) infections: Secondary | ICD-10-CM | POA: Diagnosis not present

## 2020-03-31 DIAGNOSIS — E039 Hypothyroidism, unspecified: Secondary | ICD-10-CM | POA: Diagnosis not present

## 2020-04-08 DIAGNOSIS — M25551 Pain in right hip: Secondary | ICD-10-CM | POA: Diagnosis not present

## 2020-04-08 DIAGNOSIS — K219 Gastro-esophageal reflux disease without esophagitis: Secondary | ICD-10-CM | POA: Diagnosis not present

## 2020-04-08 DIAGNOSIS — E039 Hypothyroidism, unspecified: Secondary | ICD-10-CM | POA: Diagnosis not present

## 2020-04-08 DIAGNOSIS — M5136 Other intervertebral disc degeneration, lumbar region: Secondary | ICD-10-CM | POA: Diagnosis not present

## 2020-04-08 DIAGNOSIS — Z87440 Personal history of urinary (tract) infections: Secondary | ICD-10-CM | POA: Diagnosis not present

## 2020-04-08 DIAGNOSIS — I129 Hypertensive chronic kidney disease with stage 1 through stage 4 chronic kidney disease, or unspecified chronic kidney disease: Secondary | ICD-10-CM | POA: Diagnosis not present

## 2020-04-08 DIAGNOSIS — M19011 Primary osteoarthritis, right shoulder: Secondary | ICD-10-CM | POA: Diagnosis not present

## 2020-04-08 DIAGNOSIS — E119 Type 2 diabetes mellitus without complications: Secondary | ICD-10-CM | POA: Diagnosis not present

## 2020-04-11 ENCOUNTER — Ambulatory Visit: Payer: PPO | Admitting: Cardiology

## 2020-04-11 NOTE — Progress Notes (Signed)
Rescheduled

## 2020-04-17 NOTE — Progress Notes (Signed)
Rescheduled

## 2020-04-18 ENCOUNTER — Ambulatory Visit: Payer: PPO | Admitting: Cardiology

## 2020-04-18 DIAGNOSIS — I10 Essential (primary) hypertension: Secondary | ICD-10-CM

## 2020-04-18 DIAGNOSIS — E782 Mixed hyperlipidemia: Secondary | ICD-10-CM

## 2020-04-18 DIAGNOSIS — I2510 Atherosclerotic heart disease of native coronary artery without angina pectoris: Secondary | ICD-10-CM

## 2020-05-04 ENCOUNTER — Other Ambulatory Visit: Payer: Self-pay | Admitting: Cardiology

## 2020-05-04 DIAGNOSIS — I10 Essential (primary) hypertension: Secondary | ICD-10-CM

## 2020-05-26 ENCOUNTER — Other Ambulatory Visit: Payer: Self-pay | Admitting: Family Medicine

## 2020-05-28 ENCOUNTER — Other Ambulatory Visit: Payer: Self-pay | Admitting: Family Medicine

## 2020-05-28 DIAGNOSIS — N63 Unspecified lump in unspecified breast: Secondary | ICD-10-CM

## 2020-05-29 ENCOUNTER — Other Ambulatory Visit: Payer: Self-pay | Admitting: Family Medicine

## 2020-05-29 DIAGNOSIS — N63 Unspecified lump in unspecified breast: Secondary | ICD-10-CM

## 2020-05-30 ENCOUNTER — Other Ambulatory Visit: Payer: Self-pay

## 2020-05-30 ENCOUNTER — Ambulatory Visit: Payer: PPO | Admitting: Cardiology

## 2020-05-30 ENCOUNTER — Encounter: Payer: Self-pay | Admitting: Cardiology

## 2020-05-30 ENCOUNTER — Other Ambulatory Visit: Payer: Self-pay | Admitting: Family Medicine

## 2020-05-30 VITALS — BP 150/62 | HR 67 | Temp 97.7°F | Resp 16 | Ht 64.0 in | Wt 153.0 lb

## 2020-05-30 DIAGNOSIS — I10 Essential (primary) hypertension: Secondary | ICD-10-CM | POA: Diagnosis not present

## 2020-05-30 DIAGNOSIS — E782 Mixed hyperlipidemia: Secondary | ICD-10-CM | POA: Diagnosis not present

## 2020-05-30 DIAGNOSIS — R0600 Dyspnea, unspecified: Secondary | ICD-10-CM

## 2020-05-30 DIAGNOSIS — N63 Unspecified lump in unspecified breast: Secondary | ICD-10-CM

## 2020-05-30 DIAGNOSIS — R0609 Other forms of dyspnea: Secondary | ICD-10-CM

## 2020-05-30 DIAGNOSIS — I2510 Atherosclerotic heart disease of native coronary artery without angina pectoris: Secondary | ICD-10-CM

## 2020-05-30 MED ORDER — AMLODIPINE BESYLATE 5 MG PO TABS: 10.0000 mg | ORAL_TABLET | Freq: Every day | ORAL | 0 refills | Status: DC

## 2020-05-30 NOTE — Progress Notes (Signed)
Patient is here for follow up visit.  Subjective:   @Patient  ID: Angel Day, female    DOB: 06-16-1940, 80 y.o.   MRN: [id number]   Chief Complaint  Patient presents with  . Coronary Artery Disease  . Follow-up    6 month    HPI  80 y/o Caucasian female with hypertension, hyperlipidemia, prediabetes, mild nonobstructive CAD  Patient has had worsening exertional dyspnea over the past few months.  She has to stop more often during her walk off about three quarters of a mile.  She is also experience a dull ache over her left breast area that lasts throughout the day.  She has an upcoming sonography scheduled next week for evaluation of the same.  Blood pressure is elevated today.  She does not check regularly at home.  Current Outpatient Medications on File Prior to Visit  Medication Sig Dispense Refill  . amLODipine (NORVASC) 5 MG tablet TAKE 1 TABLET BY MOUTH EVERY DAY 90 tablet 3  . aspirin 81 MG tablet Take 81 mg by mouth. Sometimes    . Cholecalciferol (VITAMIN D3) 5000 units CAPS Take 5,000 Units by mouth 2 (two) times a week.     . levothyroxine (SYNTHROID, LEVOTHROID) 50 MCG tablet Take 50 mcg by mouth daily.  5  . losartan (COZAAR) 100 MG tablet Take 100 mg by mouth daily.  10  . rosuvastatin (CRESTOR) 20 MG tablet TAKE 1 TABLET BY MOUTH EVERY DAY 90 tablet 0  . traZODone (DESYREL) 50 MG tablet Take 50 mg by mouth at bedtime. 1/2-1 tablet at bedtime     No current facility-administered medications on file prior to visit.    Cardiovascular studies:  Exercise Myoview stress test 09/17/2019: Normal myocardial perfusion. Stress LVEF 65%. Exercise nuclear stress test was performed using Bruce protocol. Patient reached 7 METS, and 85% of age predicted maximum heart rate. Exercise capacity was low. Chest pain not reported. Heart rate and hemodynamic response were normal. Stress EKG revealed no ischemic changes. Normal myocardial perfusion. Stress LVEF 65%. Low risk  study,   EKG 08/22/2019: Sinus rhythm 68 bpm Nonspecific ST-T changes  Echocardiogram 07/27/2018: Normal LV systolic function with EF 63%. Left ventricle cavity is normal in size. Normal global wall motion. Doppler evidence of grade I (impaired) diastolic dysfunction, normal LAP. Calculated EF 63%. Left atrial cavity is mildly dilated. Mild (Grade I) mitral regurgitation. Mild to moderate tricuspid regurgitation. Estimated pulmonary artery systolic pressure 29 mmHg.  CTA cor 07/12/2018: Coronaries: Ca score 309 (73rd percentile) LM: Calcified plaque at the ostium. No significant stenosis. LAD: Mixed plaque proximal and mid LAD, mild (<50%) stenosis.  LCx: Mixed plaque in mid LCx, minimal stenosis. RCA: Mixed plaque proximal and mid RCA with minimal stenosis.  Pulmonary veins drain normally to the left atrium.  IMPRESSION: 1. Coronary artery calcium score 309 Agatston units. This places the patient in the 73rd percentile for age and gender, suggesting intermediate risk for future cardiac events. 2.  Nonobstructive coronary disease. 3. Aortic atherosclerosis. 4. Small hiatal hernia.   Exercise myoview stress 03/22/2018:  1. The patient performed treadmill exercise using Bruce protocol, completing 5:01 minutes. The patient completed an estimated workload of 7 METS, reaching 86% of the maximum predicted heart rate. Exercise capacity was low. Hemodynamic response was normal. Stress symptoms included exercise induced chest pain, relieved with rest.  Stress electrocardiogram was positive for ischemia, showing 1.5-2 mm upsloping ST depressions in leads II, III, aVF. ST-T changes returned to baseline 2  min into recovery. 2. The overall quality of the study is good. There is no evidence of abnormal lung activity. Stress and rest SPECT images demonstrate homogeneous tracer distribution throughout the myocardium. Gated SPECT imaging reveals normal myocardial thickening and wall motion. The  left ventricular ejection fraction was normal (57%).   3. Intermediate risk study due to EKG changes and exercise induced chest pain. Clinical correlation recommended.  EKG 03/17/2018: Sinus rhythm 73 bpm. Normal axis. Normal conduction. Normal EKG.  Recent labs: 10/30/2019: Glucose 130, BUN/Cr 28/1.0. EGFR 51. HbA1C 6.6% Chol 127, TG 165, HDL 41, LDL 58 TSH 4.3 normal  Jan-Apr 2021: Glucose 1054, BUN/Cr 24/1.0. EGFR 53. HbA1C 6.4% Chol 116, TG 145, HDL 41, LDL 43 TSH 2.6 normal  01/13/2018:  12/2017: Glucose 124, Cr 0.94. EGFR 58. Na/K 141/5.2. 12Rest of the CMP normal H/H 12/37. MCV 94. Platelets 302 HbA1C 6.1% Chol 206, TG 197, HDL 43, LDL 124   Review of Systems  Constitutional: Positive for malaise/fatigue.  Cardiovascular: Positive for dyspnea on exertion. Negative for chest pain, leg swelling, palpitations and syncope.        Objective:    Vitals:   05/30/20 1303  BP: (!) 150/62  Pulse: 67  Resp: 16  Temp: 97.7 F (36.5 C)  SpO2: 96%     Physical Exam Vitals and nursing note reviewed.  Constitutional:      General: She is not in acute distress. Neck:     Vascular: No JVD.  Cardiovascular:     Rate and Rhythm: Normal rate and regular rhythm.     Pulses: Normal pulses and intact distal pulses.     Heart sounds: Normal heart sounds. No murmur heard.   Pulmonary:     Effort: Pulmonary effort is normal.     Breath sounds: Normal breath sounds. No wheezing or rales.  Musculoskeletal:     Right lower leg: No edema.     Left lower leg: No edema.         Assessment & Recommendations:    80 y/o Caucasian female with hypertension, hyperlipidemia, prediabetes, mild nonobstructive CAD, now with exertional dyspnea.  Exertional dyspnea: Could be multifactorial-including uncontrolled hypertension, possible diastolic dysfunction, as well as CAD causing angina equivalent symptoms. Increase amlodipine to 10 mg daily.  Will obtain an echocardiogram. CTA  in 2020 showed elevated calcium with mild multivessel disease including left main.  Stress test in 08/2019 did not show any ischemia.  Given possibility of balanced ischemia, I would not repeat stress test for further evaluation of CAD.  If her symptoms persist in spite of good blood pressure control, I would then recommend left heart catheterization and coronary angiogram. Continue Aspirin, rosuvastatin 20 mg.   Hypertension: As above  Hyperlipidemia: Very well controlled  F/u in 4 weeks  Omie Ferger [name], MD [facility] [place] Pager: [phone] Office: [phone] If no answer Cell [phone]

## 2020-06-04 ENCOUNTER — Other Ambulatory Visit: Payer: Self-pay

## 2020-06-04 ENCOUNTER — Ambulatory Visit
Admission: RE | Admit: 2020-06-04 | Discharge: 2020-06-04 | Disposition: A | Discharge status: Home / Self Care | Payer: PPO | Source: Ambulatory Visit | Attending: Family Medicine | Admitting: Family Medicine

## 2020-06-04 DIAGNOSIS — N6323 Unspecified lump in the left breast, lower outer quadrant: Secondary | ICD-10-CM | POA: Diagnosis not present

## 2020-06-04 DIAGNOSIS — N644 Mastodynia: Secondary | ICD-10-CM | POA: Diagnosis not present

## 2020-06-04 DIAGNOSIS — N63 Unspecified lump in unspecified breast: Secondary | ICD-10-CM

## 2020-06-05 ENCOUNTER — Ambulatory Visit: Payer: PPO

## 2020-06-05 DIAGNOSIS — R0600 Dyspnea, unspecified: Secondary | ICD-10-CM

## 2020-06-05 DIAGNOSIS — R0609 Other forms of dyspnea: Secondary | ICD-10-CM

## 2020-07-01 DIAGNOSIS — E119 Type 2 diabetes mellitus without complications: Secondary | ICD-10-CM | POA: Diagnosis not present

## 2020-07-01 DIAGNOSIS — E559 Vitamin D deficiency, unspecified: Secondary | ICD-10-CM | POA: Diagnosis not present

## 2020-07-01 DIAGNOSIS — E7841 Elevated Lipoprotein(a): Secondary | ICD-10-CM | POA: Diagnosis not present

## 2020-07-01 DIAGNOSIS — E039 Hypothyroidism, unspecified: Secondary | ICD-10-CM | POA: Diagnosis not present

## 2020-07-03 ENCOUNTER — Other Ambulatory Visit: Payer: PPO

## 2020-07-08 DIAGNOSIS — I2510 Atherosclerotic heart disease of native coronary artery without angina pectoris: Secondary | ICD-10-CM | POA: Diagnosis not present

## 2020-07-08 DIAGNOSIS — M19011 Primary osteoarthritis, right shoulder: Secondary | ICD-10-CM | POA: Diagnosis not present

## 2020-07-08 DIAGNOSIS — E039 Hypothyroidism, unspecified: Secondary | ICD-10-CM | POA: Diagnosis not present

## 2020-07-08 DIAGNOSIS — E119 Type 2 diabetes mellitus without complications: Secondary | ICD-10-CM | POA: Diagnosis not present

## 2020-07-08 DIAGNOSIS — Z0000 Encounter for general adult medical examination without abnormal findings: Secondary | ICD-10-CM | POA: Diagnosis not present

## 2020-07-08 DIAGNOSIS — I129 Hypertensive chronic kidney disease with stage 1 through stage 4 chronic kidney disease, or unspecified chronic kidney disease: Secondary | ICD-10-CM | POA: Diagnosis not present

## 2020-07-08 DIAGNOSIS — K219 Gastro-esophageal reflux disease without esophagitis: Secondary | ICD-10-CM | POA: Diagnosis not present

## 2020-07-08 DIAGNOSIS — M5136 Other intervertebral disc degeneration, lumbar region: Secondary | ICD-10-CM | POA: Diagnosis not present

## 2020-07-08 DIAGNOSIS — F419 Anxiety disorder, unspecified: Secondary | ICD-10-CM | POA: Diagnosis not present

## 2020-07-11 ENCOUNTER — Ambulatory Visit: Payer: PPO | Admitting: Cardiology

## 2020-07-23 NOTE — Progress Notes (Signed)
Patient is here for follow up visit.  Subjective:   _0  ID: Angel Day, female    DOB: [birth year], 80 y.o.   MRN: [id number]   Chief Complaint  Patient presents with  . Coronary Artery Disease  . Follow-up    HPI  80 y/o Caucasian female with hypertension, hyperlipidemia, prediabetes, mild nonobstructive CAD  Patients dyspnea has improved since last visit. She had edema with amlodipine 10 mg. She is walking more, without any symptoms.   Current Outpatient Medications on File Prior to Visit  Medication Sig Dispense Refill  . amLODipine (NORVASC) 5 MG tablet Take 2 tablets (10 mg total) by mouth daily. 1 tablet 0  . aspirin 81 MG tablet Take 81 mg by mouth. Sometimes    . Cholecalciferol (VITAMIN D3) 5000 units CAPS Take 5,000 Units by mouth 2 (two) times a week.     . levothyroxine (SYNTHROID, LEVOTHROID) 50 MCG tablet Take 50 mcg by mouth daily.  5  . losartan (COZAAR) 100 MG tablet Take 100 mg by mouth daily.  10  . rosuvastatin (CRESTOR) 20 MG tablet TAKE 1 TABLET BY MOUTH EVERY DAY 90 tablet 0  . traZODone (DESYREL) 50 MG tablet Take 50 mg by mouth at bedtime. 1/2-1 tablet at bedtime     No current facility-administered medications on file prior to visit.    Cardiovascular studies:  Echocardiogram 06/05/2020:  Left ventricle cavity is normal in size and wall thickness. Normal global  wall motion. Normal LV systolic function with EF 57%. Indeterminate  diastolic filling pattern.  Mild (Grade I) mitral regurgitation.  Mild to moderate tricuspid regurgitation. Estimated pulmonary artery  systolic pressure 29 mmHg.  No significant change compared to previous study on 07/27/2018.  Exercise Myoview stress test 09/17/2019: Normal myocardial perfusion. Stress LVEF 65%. Exercise nuclear stress test was performed using Bruce protocol. Patient reached 7 METS, and 85% of age predicted maximum heart rate. Exercise capacity was low. Chest pain not reported. Heart rate and  hemodynamic response were normal. Stress EKG revealed no ischemic changes. Normal myocardial perfusion. Stress LVEF 65%. Low risk study,   EKG 08/22/2019: Sinus rhythm 68 bpm Nonspecific ST-T changes  Echocardiogram 07/27/2018: Normal LV systolic function with EF 63%. Left ventricle cavity is normal in size. Normal global wall motion. Doppler evidence of grade I (impaired) diastolic dysfunction, normal LAP. Calculated EF 63%. Left atrial cavity is mildly dilated. Mild (Grade I) mitral regurgitation. Mild to moderate tricuspid regurgitation. Estimated pulmonary artery systolic pressure 29 mmHg.  CTA cor 07/12/2018: Coronaries: Ca score 309 (73rd percentile) LM: Calcified plaque at the ostium. No significant stenosis. LAD: Mixed plaque proximal and mid LAD, mild (<50%) stenosis.  LCx: Mixed plaque in mid LCx, minimal stenosis. RCA: Mixed plaque proximal and mid RCA with minimal stenosis.  Pulmonary veins drain normally to the left atrium.  IMPRESSION: 1. Coronary artery calcium score 309 Agatston units. This places the patient in the 73rd percentile for age and gender, suggesting intermediate risk for future cardiac events. 2.  Nonobstructive coronary disease. 3. Aortic atherosclerosis. 4. Small hiatal hernia.   Exercise myoview stress 03/22/2018:  1. The patient performed treadmill exercise using Bruce protocol, completing 5:01 minutes. The patient completed an estimated workload of 7 METS, reaching 86% of the maximum predicted heart rate. Exercise capacity was low. Hemodynamic response was normal. Stress symptoms included exercise induced chest pain, relieved with rest.  Stress electrocardiogram was positive for ischemia, showing 1.5-2 mm upsloping ST depressions in leads II, III, aVF.  ST-T changes returned to baseline 2 min into recovery. 2. The overall quality of the study is good. There is no evidence of abnormal lung activity. Stress and rest SPECT images demonstrate  homogeneous tracer distribution throughout the myocardium. Gated SPECT imaging reveals normal myocardial thickening and wall motion. The left ventricular ejection fraction was normal (57%).   3. Intermediate risk study due to EKG changes and exercise induced chest pain. Clinical correlation recommended.  EKG 03/17/2018: Sinus rhythm 73 bpm. Normal axis. Normal conduction. Normal EKG.  Recent labs: 10/30/2019: Glucose 130, BUN/Cr 28/1.0. EGFR 51. HbA1C 6.6% Chol 127, TG 165, HDL 41, LDL 58 TSH 4.3 normal  Jan-Apr 2021: Glucose 1054, BUN/Cr 24/1.0. EGFR 53. HbA1C 6.4% Chol 116, TG 145, HDL 41, LDL 43 TSH 2.6 normal  01/13/2018:  12/2017: Glucose 124, Cr 0.94. EGFR 58. Na/K 141/5.2. 12Rest of the CMP normal H/H 12/37. MCV 94. Platelets 302 HbA1C 6.1% Chol 206, TG 197, HDL 43, LDL 124   Review of Systems  Constitutional: Negative for malaise/fatigue.  Cardiovascular: Negative for chest pain, dyspnea on exertion, leg swelling, palpitations and syncope.        Objective:    Vitals:   07/24/20 1418  BP: 135/62  Pulse: 68  Temp: 97.9 F (36.6 C)  SpO2: 96%      Physical Exam Vitals and nursing note reviewed.  Constitutional:      General: She is not in acute distress. Neck:     Vascular: No JVD.  Cardiovascular:     Rate and Rhythm: Normal rate and regular rhythm.     Pulses: Normal pulses and intact distal pulses.     Heart sounds: Normal heart sounds. No murmur heard.   Pulmonary:     Effort: Pulmonary effort is normal.     Breath sounds: Normal breath sounds. No wheezing or rales.  Musculoskeletal:     Right lower leg: No edema.     Left lower leg: No edema.         Assessment & Recommendations:    80 y/o Caucasian female with hypertension, hyperlipidemia, prediabetes, mild nonobstructive CAD, now with exertional dyspnea.  Exertional dyspnea: Improved. Continue current medical therapy.  CAD:  Nonobstructive. Continue Aspirin,  statin  Hypertension: As above  Hyperlipidemia: Very well controlled  F/u in 6 months  Manish [name], MD [facility] [place] Pager: [phone] Office: [phone] If no answer Cell [phone]

## 2020-07-24 ENCOUNTER — Other Ambulatory Visit: Payer: Self-pay

## 2020-07-24 ENCOUNTER — Ambulatory Visit: Payer: PPO | Admitting: Cardiology

## 2020-07-24 ENCOUNTER — Encounter: Payer: Self-pay | Admitting: Cardiology

## 2020-07-24 VITALS — BP 135/62 | HR 68 | Temp 97.9°F | Ht 64.0 in | Wt 149.6 lb

## 2020-07-24 DIAGNOSIS — I2510 Atherosclerotic heart disease of native coronary artery without angina pectoris: Secondary | ICD-10-CM

## 2020-07-24 DIAGNOSIS — I10 Essential (primary) hypertension: Secondary | ICD-10-CM

## 2020-07-24 DIAGNOSIS — E782 Mixed hyperlipidemia: Secondary | ICD-10-CM

## 2020-08-07 ENCOUNTER — Other Ambulatory Visit: Payer: Self-pay

## 2020-08-07 ENCOUNTER — Encounter (HOSPITAL_COMMUNITY): Payer: Self-pay | Admitting: Emergency Medicine

## 2020-08-07 ENCOUNTER — Emergency Department (HOSPITAL_COMMUNITY)
Admission: EM | Admit: 2020-08-07 | Discharge: 2020-08-07 | Disposition: A | Discharge status: Home / Self Care | Payer: PPO | Attending: Emergency Medicine | Admitting: Emergency Medicine

## 2020-08-07 ENCOUNTER — Emergency Department (HOSPITAL_COMMUNITY): Payer: PPO

## 2020-08-07 DIAGNOSIS — Z79899 Other long term (current) drug therapy: Secondary | ICD-10-CM | POA: Insufficient documentation

## 2020-08-07 DIAGNOSIS — W010XXA Fall on same level from slipping, tripping and stumbling without subsequent striking against object, initial encounter: Secondary | ICD-10-CM | POA: Diagnosis not present

## 2020-08-07 DIAGNOSIS — Z7982 Long term (current) use of aspirin: Secondary | ICD-10-CM | POA: Diagnosis not present

## 2020-08-07 DIAGNOSIS — E119 Type 2 diabetes mellitus without complications: Secondary | ICD-10-CM | POA: Insufficient documentation

## 2020-08-07 DIAGNOSIS — Z87891 Personal history of nicotine dependence: Secondary | ICD-10-CM | POA: Insufficient documentation

## 2020-08-07 DIAGNOSIS — I2510 Atherosclerotic heart disease of native coronary artery without angina pectoris: Secondary | ICD-10-CM | POA: Insufficient documentation

## 2020-08-07 DIAGNOSIS — M25551 Pain in right hip: Secondary | ICD-10-CM

## 2020-08-07 DIAGNOSIS — I10 Essential (primary) hypertension: Secondary | ICD-10-CM | POA: Insufficient documentation

## 2020-08-07 MED ORDER — CYCLOBENZAPRINE HCL 5 MG PO TABS: 5.0000 mg | ORAL_TABLET | Freq: Two times a day (BID) | ORAL | 0 refills | Status: DC | PRN

## 2020-08-07 MED ORDER — LIDOCAINE 5 % EX PTCH: 1.0000 | MEDICATED_PATCH | CUTANEOUS | 0 refills | Status: DC

## 2020-08-07 MED ORDER — CYCLOBENZAPRINE HCL 10 MG PO TABS
5.0000 mg | ORAL_TABLET | Freq: Once | ORAL | Status: AC
  Administered 2020-08-07: 5 mg via ORAL
  Filled 2020-08-07: qty 1

## 2020-08-07 MED ORDER — LIDOCAINE 5 % EX PTCH
1.0000 | MEDICATED_PATCH | CUTANEOUS | Status: DC
  Administered 2020-08-07: 1 via TRANSDERMAL
  Filled 2020-08-07: qty 1

## 2020-08-07 NOTE — Discharge Instructions (Addendum)
Your history exam and work-up today are consistent with likely soft tissue or muscular injury to the right hip.  The x-rays do not show any evidence of fracture or dislocation.  As you are able to safely ambulate with a walker, we feel you are safe for discharge home with PT to come evaluate at home and use the muscle medicine and the numbing patches to help with discomfort.  You may also use over-the-counter anti-inflammatory medication as we discussed.  Please follow-up with outpatient orthopedics as you may end up needing advanced imaging to look for tendinous, ligamentous, or other soft tissue injuries not assessed in the emergency department today.  If any symptoms change or worsen acutely, please return to the nearest emergency department.  Please be careful not to fall.

## 2020-08-07 NOTE — ED Provider Notes (Signed)
[facility] Provider Note   CSN: [id number] Arrival date & time: 08/07/20  1113     History Chief Complaint  Patient presents with   Hip Pain    Angel Day is a 80 y.o. female.  The history is provided by the patient and medical records. No language interpreter was used.  Hip Pain This is a new problem. The current episode started 12 to 24 hours ago. The problem occurs constantly. The problem has not changed since onset.Pertinent negatives include no chest pain, no abdominal pain, no headaches and no shortness of breath. The symptoms are aggravated by walking and standing. Nothing relieves the symptoms. She has tried nothing for the symptoms. The treatment provided no relief.      Past Medical History:  Diagnosis Date   Depression    Diabetes (HCC)    High cholesterol    Hypertension     Patient Active Problem List   Diagnosis Date Noted   Coronary artery disease involving native coronary artery of native heart without angina pectoris 08/22/2019   Mixed hyperlipidemia 02/02/2019   Exertional dyspnea 06/28/2018   Bilateral leg pain 05/11/2018   Leg edema 05/11/2018   C. difficile diarrhea 05/09/2017   Facial twitching 03/31/2015   Tinnitus 03/31/2015   Essential hypertension 03/31/2015   Impaired glucose tolerance 03/31/2015    Past Surgical History:  Procedure Laterality Date   BREAST REDUCTION SURGERY  1980   REDUCTION MAMMAPLASTY Bilateral    REFRACTIVE SURGERY  1991   VAGINAL HYSTERECTOMY  1992     OB History   No obstetric history on file.     Family History  Problem Relation Age of Onset   Breast cancer Mother    Cancer Mother        sarcoma   Heart attack Father    Heart attack Brother     Social History   Tobacco Use   Smoking status: Former    Packs/day: 0.50    Years: 10.00    Pack years: 5.00    Types: Cigarettes    Quit date: 03/02/1975    Years since quitting: 45.4   Smokeless tobacco:  Never  Vaping Use   Vaping Use: Never used  Substance Use Topics   Alcohol use: No    Alcohol/week: 0.0 standard drinks   Drug use: No    Home Medications Prior to Admission medications   Medication Sig Start Date End Date Taking? Authorizing Provider  amLODipine (NORVASC) 5 MG tablet Take 5 mg by mouth daily.    [provider]  aspirin 81 MG tablet Take 81 mg by mouth. Sometimes    [provider]  Cholecalciferol (VITAMIN D3) 5000 units CAPS Take 5,000 Units by mouth 2 (two) times a week.     [provider]  levothyroxine (SYNTHROID, LEVOTHROID) 50 MCG tablet Take 50 mcg by mouth daily. 03/02/17   [provider]  losartan (COZAAR) 100 MG tablet Take 100 mg by mouth daily. 04/13/17   [provider]  rosuvastatin (CRESTOR) 20 MG tablet TAKE 1 TABLET BY MOUTH EVERY DAY 12/25/19   Patwardhan, Manish J, MD    Allergies    Penicillins  Review of Systems   Review of Systems  Constitutional:  Negative for chills, diaphoresis, fatigue and fever.  HENT:  Negative for congestion and rhinorrhea.   Respiratory:  Negative for cough, chest tightness, shortness of breath and wheezing.   Cardiovascular:  Negative for chest pain, palpitations  and leg swelling.  Gastrointestinal:  Negative for abdominal pain, constipation, diarrhea, nausea and vomiting.  Genitourinary:  Negative for dysuria, flank pain and frequency.  Musculoskeletal:  Negative for back pain, neck pain and neck stiffness.  Skin:  Negative for rash and wound.  Neurological:  Negative for dizziness, weakness, light-headedness, numbness and headaches.  Psychiatric/Behavioral:  Negative for agitation.   All other systems reviewed and are negative.  Physical Exam Updated Vital Signs BP (!) 145/99 (BP Location: Right Arm)   Pulse 60   Temp 98.1 F (36.7 C) (Oral)   Resp 18   SpO2 98%   Physical Exam Vitals and nursing note reviewed.  Constitutional:      General: She is not in  acute distress.    Appearance: She is well-developed. She is not ill-appearing, toxic-appearing or diaphoretic.  HENT:     Head: Normocephalic and atraumatic.     Right Ear: External ear normal.     Left Ear: External ear normal.     Nose: Nose normal.     Mouth/Throat:     Pharynx: No oropharyngeal exudate.  Eyes:     Conjunctiva/sclera: Conjunctivae normal.     Pupils: Pupils are equal, round, and reactive to light.  Cardiovascular:     Rate and Rhythm: Normal rate.     Pulses: Normal pulses.  Pulmonary:     Effort: No respiratory distress.     Breath sounds: No stridor. No wheezing, rhonchi or rales.  Chest:     Chest wall: No tenderness.  Abdominal:     General: Abdomen is flat. There is no distension.     Tenderness: There is no abdominal tenderness. There is no right CVA tenderness, left CVA tenderness, guarding or rebound.  Musculoskeletal:        General: Tenderness present. No signs of injury.     Cervical back: Normal range of motion and neck supple.     Right hip: Tenderness present. No deformity or lacerations.     Right lower leg: No edema.     Left lower leg: No edema.       Legs:     Comments: Tenderness in the right anterior hip but no deformity.  Intact sensation, strength, and pulses in the ankle.  Pain with range of motion.  Hips and pelvis are stable.  Abdomen and other back nontender.  Exam otherwise unremarkable.  Skin:    General: Skin is warm.     Capillary Refill: Capillary refill takes less than 2 seconds.     Coloration: Skin is not pale.     Findings: No erythema or rash.  Neurological:     Mental Status: She is alert and oriented to person, place, and time.     Sensory: No sensory deficit.     Motor: No weakness or abnormal muscle tone.     Deep Tendon Reflexes: Reflexes are normal and symmetric.  Psychiatric:        Mood and Affect: Mood normal.    ED Results / Procedures / Treatments   Labs (all labs ordered are listed, but only abnormal  results are displayed) Labs Reviewed - No data to display  EKG None  Radiology DG Hip Unilat  With Pelvis 2-3 Views Right  Result Date: 08/07/2020 CLINICAL DATA:  Right hip pain after a fall yesterday. Initial encounter. EXAM: DG HIP (WITH OR WITHOUT PELVIS) 2-3V RIGHT COMPARISON:  CT abdomen and pelvis 05/09/2017. FINDINGS: There is no acute bony or joint abnormality. Mild  bilateral hip osteoarthritis is noted. There is partial visualization of convex left lumbar scoliosis and degenerative disease. Soft tissues are negative. IMPRESSION: No acute abnormality. Electronically Signed   By: Drusilla Kanner M.D.   On: 08/07/2020 13:12    Procedures Procedures   Medications Ordered in ED Medications  lidocaine (LIDODERM) 5 % 1 patch (1 patch Transdermal Patch Applied 08/07/20 1550)  cyclobenzaprine (FLEXERIL) tablet 5 mg (5 mg Oral Given 08/07/20 1550)    ED Course  I have reviewed the triage vital signs and the nursing notes.  Pertinent labs & imaging results that were available during my care of the patient were reviewed by me and considered in my medical decision making (see chart for details).    MDM Rules/Calculators/A&P                          Angel Day is a 80 y.o. female with a past medical history significant for hypertension, hypercholesterolemia, bilateral hip arthritis and low back arthritis, and diabetes who presents with right hip pain after injury.  She reports that last night she was walking to go turn the fan on in the [place] when she caught her left foot on something and she had to plant strongly with her right leg and felt a sudden onset of pain in her right hip.  She reports has had pain with walking and lying flat but sitting up with the pain improves.  She denies a history of actual hip injury or fractures or surgery in the past.  She reports no loss of bowel or bladder control.  No numbness, tingling, or weakness.  She reports the pain is severe at times.  She  denies any bruising, swelling, deformity, or lacerations.  No other complaints and specifically no preceding symptoms elsewise.  On exam, lungs clear and chest nontender.  Abdomen tender, low back nontender.  Patient does have tenderness in the right hip but primarily in the anterior area and in the inguinal area.  She had pain with hip manipulation passively but had intact strength in the feet bilaterally.  Intact sensation and pulses.  No laceration or bruising seen.  Pelvis was stable on exam.  Clinically I suspect soft tissue injury as it was a noncontact planting and twisting injury mechanism.  X-rays were obtained showing no acute fracture or dislocation.  Has patient is still hurting, will give muscle relaxant and Lidoderm patch to see if we can help with her discomfort so that she can ambulate.  We will attempt ambulation with a walker.  I suspect she will be stable for discharge home with home health/PT and follow-up with orthopedics team however she does report she lives alone and currently is having significant difficulty walking due to the pain.  Anticipate reassessment  Patient was able to get a walker and ambulate around the emergency department safely.  She reports the medications are starting to help.  She will give prescription for low-dose Flexeril and Lidoderm patches and will follow-up with orthopedics.  Family reportedly stay with her for the next 2 days.  Patient agrees with plan of care and understands return precautions.  She no questions or concerns and was discharged in good condition.   Final Clinical Impression(s) / ED Diagnoses Final diagnoses:  Right hip pain    Rx / DC Orders ED Discharge Orders          Ordered    Home Health  08/07/20 1632    Face-to-face encounter (required for Medicare/Medicaid patients)       Comments: I Canary Brim Delta Deshmukh certify that this patient is under my care and that I, or a nurse practitioner or physician's assistant  working with me, had a face-to-face encounter that meets the physician face-to-face encounter requirements with this patient on 08/07/2020. The encounter with the patient was in whole, or in part for the following medical condition(s) which is the primary reason for home health care (List medical condition):   08/07/20 1632    cyclobenzaprine (FLEXERIL) 5 MG tablet  2 times daily PRN        08/07/20 1900    lidocaine (LIDODERM) 5 %  Every 24 hours        08/07/20 1900            Clinical Impression: 1. Right hip pain     Disposition: Discharge  Condition: Good  I have discussed the results, Dx and Tx plan with the pt(& family if present). He/she/they expressed understanding and agree(s) with the plan. Discharge instructions discussed at great length. Strict return precautions discussed and pt &/or family have verbalized understanding of the instructions. No further questions at time of discharge.    New Prescriptions   CYCLOBENZAPRINE (FLEXERIL) 5 MG TABLET    Take 1 tablet (5 mg total) by mouth 2 (two) times daily as needed for muscle spasms.   LIDOCAINE (LIDODERM) 5 %    Place 1 patch onto the skin daily. Remove & Discard patch within 12 hours or as directed by MD    Follow Up: Toni Arthurs, MD [address] [place] [address] [phone]   with orthopedics  [facility] EMERGENCY DEPARTMENT [address] [id number] mc [place] [address] [phone]    Irena Reichmann, DO [address] [place] [address] [place] [address] [phone]        Asjah Rauda, Canary Brim, MD 08/07/20 [phone]

## 2020-08-07 NOTE — Social Work (Signed)
CSW met with Pt and daughter, Altha Harm, at bedside.  Pt and daughter report that daughter will be staying with Pt to assist until Pt regains mobility. Pt prefers to go home with [facility]. EDP updated. CSW provided Pt with walker from [employer].

## 2020-08-07 NOTE — ED Triage Notes (Signed)
Patient here for evaluation of right hip pain and tripped and catching herself last night. Patient did not fall but felt sudden onset of right hip pain after incident. Patient alert, oriented, and in no apparent distress at this time.

## 2020-08-07 NOTE — ED Notes (Signed)
SW saw pt. Plan is to arrange home health to see pt and sent pt home with either walker or wheelchair.

## 2020-08-07 NOTE — Progress Notes (Addendum)
08/08/2020 1100 am Received confirmation from [facility] rep, [facility]. They have accepted referral for [facility]. Isidoro Donning RN CCM, [facility] ED TOC CM [phone]   08/07/2020 541 pm TOC CM sent referral to [facility] to review for [facility] PT. Sent message to [employer] rep, Ian Malkin for billing of RW. Isidoro Donning RN CCM, [facility] ED TOC CM [phone]

## 2020-08-07 NOTE — ED Notes (Signed)
Able to ambulate on a steady gait using a walker. Pt was able to walk a few steps in the room. Will continue to monitor.

## 2020-08-07 NOTE — ED Notes (Signed)
Sent home with a walker from case management. Pt ambulated to the hallway and back to room on a steady gait. Daughter is taking pt home. Transported by ED tech by wheelchair out of the department.

## 2020-08-14 ENCOUNTER — Other Ambulatory Visit: Payer: Self-pay | Admitting: Cardiology

## 2020-08-14 DIAGNOSIS — E785 Hyperlipidemia, unspecified: Secondary | ICD-10-CM

## 2020-08-20 DIAGNOSIS — M7061 Trochanteric bursitis, right hip: Secondary | ICD-10-CM | POA: Diagnosis not present

## 2020-11-13 ENCOUNTER — Other Ambulatory Visit: Payer: Self-pay | Admitting: Cardiology

## 2020-11-13 DIAGNOSIS — E785 Hyperlipidemia, unspecified: Secondary | ICD-10-CM

## 2021-01-02 ENCOUNTER — Encounter: Payer: Self-pay | Admitting: Cardiology

## 2021-01-02 ENCOUNTER — Ambulatory Visit: Payer: PPO | Admitting: Cardiology

## 2021-01-02 ENCOUNTER — Other Ambulatory Visit: Payer: Self-pay

## 2021-01-02 VITALS — BP 152/72 | HR 76 | Temp 97.6°F | Resp 16 | Ht 64.0 in | Wt 145.0 lb

## 2021-01-02 DIAGNOSIS — M79604 Pain in right leg: Secondary | ICD-10-CM | POA: Diagnosis not present

## 2021-01-02 DIAGNOSIS — I2510 Atherosclerotic heart disease of native coronary artery without angina pectoris: Secondary | ICD-10-CM

## 2021-01-02 DIAGNOSIS — R0989 Other specified symptoms and signs involving the circulatory and respiratory systems: Secondary | ICD-10-CM | POA: Diagnosis not present

## 2021-01-02 DIAGNOSIS — R202 Paresthesia of skin: Secondary | ICD-10-CM | POA: Diagnosis not present

## 2021-01-02 DIAGNOSIS — R600 Localized edema: Secondary | ICD-10-CM | POA: Diagnosis not present

## 2021-01-02 DIAGNOSIS — M79605 Pain in left leg: Secondary | ICD-10-CM | POA: Diagnosis not present

## 2021-01-02 MED ORDER — FUROSEMIDE 20 MG PO TABS: 20.0000 mg | ORAL_TABLET | Freq: Every day | ORAL | 3 refills | Status: DC | PRN

## 2021-01-02 NOTE — Progress Notes (Signed)
Patient is here for follow up visit.  Subjective:   @Patient  ID: Angel Day, female    DOB: [birth year], 80 y.o.   MRN: [id number]   Chief Complaint  Patient presents with   Coronary Artery Disease   Hypertension   Follow-up    6 month    HPI  80 y/o Caucasian female with hypertension, hyperlipidemia, prediabetes, mild nonobstructive CAD  Patient has recently noticed symptoms of "crawling sensation" on her legs and feet, along with pain at rest. She denies having significant calf or thigh pain with walking 1 mile, as she does so regularly.   Current Outpatient Medications on File Prior to Visit  Medication Sig Dispense Refill   amLODipine (NORVASC) 5 MG tablet Take 5 mg by mouth daily.     aspirin 81 MG tablet Take 81 mg by mouth. Sometimes     levothyroxine (SYNTHROID, LEVOTHROID) 50 MCG tablet Take 50 mcg by mouth daily.  5   losartan (COZAAR) 100 MG tablet Take 100 mg by mouth daily.  10   rosuvastatin (CRESTOR) 20 MG tablet TAKE 1 TABLET BY MOUTH EVERY DAY 90 tablet 0   No current facility-administered medications on file prior to visit.    Cardiovascular studies:  EKG 01/02/2021: Sinus rhythm 74 bpm Nonspecific T-abnormality  Echocardiogram 06/05/2020:  Left ventricle cavity is normal in size and wall thickness. Normal global  wall motion. Normal LV systolic function with EF 57%. Indeterminate  diastolic filling pattern.  Mild (Grade I) mitral regurgitation.  Mild to moderate tricuspid regurgitation. Estimated pulmonary artery  systolic pressure 29 mmHg.  No significant change compared to previous study on 07/27/2018.  Exercise Myoview stress test 09/17/2019: Normal myocardial perfusion. Stress LVEF 65%. Exercise nuclear stress test was performed using Bruce protocol. Patient reached 7 METS, and 85% of age predicted maximum heart rate. Exercise capacity was low. Chest pain not reported. Heart rate and hemodynamic response were normal. Stress EKG revealed no  ischemic changes. Normal myocardial perfusion. Stress LVEF 65%. Low risk study,  Echocardiogram 07/27/2018: Normal LV systolic function with EF 63%. Left ventricle cavity is normal in size. Normal global wall motion. Doppler evidence of grade I (impaired) diastolic dysfunction, normal LAP. Calculated EF 63%. Left atrial cavity is mildly dilated. Mild (Grade I) mitral regurgitation. Mild to moderate tricuspid regurgitation. Estimated pulmonary artery systolic pressure 29 mmHg.  ABI 07/27/2018:  This exam reveals normal perfusion of the right lower extremity (ABI  1.00). Normal triphasic spectral waveforms of the right ankle.  Mildly decreased perfusion of the left lower extremity, noted at the  anterior tibial and post tibial artery level (ABI 0.96). Mildly abnormal  spectral waveforms of the left ankle.  CTA cor 07/12/2018: Coronaries: Ca score 309 (73rd percentile) LM: Calcified plaque at the ostium. No significant stenosis. LAD: Mixed plaque proximal and mid LAD, mild (<50%) stenosis.  LCx: Mixed plaque in mid LCx, minimal stenosis. RCA: Mixed plaque proximal and mid RCA with minimal stenosis.   Pulmonary veins drain normally to the left atrium.   IMPRESSION: 1. Coronary artery calcium score 309 Agatston units. This places the patient in the 73rd percentile for age and gender, suggesting intermediate risk for future cardiac events. 2.  Nonobstructive coronary disease. 3. Aortic atherosclerosis. 4. Small hiatal hernia.   Exercise myoview stress 03/22/2018:  1. The patient performed treadmill exercise using Bruce protocol, completing 5:01 minutes. The patient completed an estimated workload of 7 METS, reaching 86% of the maximum predicted heart rate. Exercise capacity was  low. Hemodynamic response was normal. Stress symptoms included exercise induced chest pain, relieved with rest.  Stress electrocardiogram was positive for ischemia, showing 1.5-2 mm upsloping ST depressions in  leads II, III, aVF. ST-T changes returned to baseline 2 min into recovery. 2. The overall quality of the study is good. There is no evidence of abnormal lung activity. Stress and rest SPECT images demonstrate homogeneous tracer distribution throughout the myocardium. Gated SPECT imaging reveals normal myocardial thickening and wall motion. The left ventricular ejection fraction was normal (57%).   3. Intermediate risk study due to EKG changes and exercise induced chest pain. Clinical correlation recommended.  EKG 03/17/2018: Sinus rhythm 73 bpm. Normal axis. Normal conduction. Normal EKG.  Recent labs: 10/30/2019: Glucose 130, BUN/Cr 28/1.0. EGFR 51. HbA1C 6.6% Chol 127, TG 165, HDL 41, LDL 58 TSH 4.3 normal  Jan-Apr 2021: Glucose 1054, BUN/Cr 24/1.0. EGFR 53. HbA1C 6.4% Chol 116, TG 145, HDL 41, LDL 43 TSH 2.6 normal  01/13/2018:  12/2017: Glucose 124, Cr 0.94. EGFR 58. Na/K 141/5.2. 12Rest of the CMP normal H/H 12/37. MCV 94. Platelets 302 HbA1C 6.1% Chol 206, TG 197, HDL 43, LDL 124   Review of Systems  Constitutional: Negative for malaise/fatigue.  Cardiovascular:  Negative for chest pain, dyspnea on exertion, leg swelling, palpitations and syncope.  Neurological:  Positive for paresthesias.       Objective:    Vitals:   01/02/21 1124  BP: (!) 152/72  Pulse: 76  Resp: 16  Temp: 97.6 F (36.4 C)  SpO2: 95%      Physical Exam Vitals and nursing note reviewed.  Constitutional:      General: She is not in acute distress. Neck:     Vascular: No JVD.  Cardiovascular:     Rate and Rhythm: Normal rate and regular rhythm.     Pulses: Intact distal pulses.          Dorsalis pedis pulses are 1+ on the right side and 1+ on the left side.       Posterior tibial pulses are 2+ on the right side and 0 on the left side.     Heart sounds: Normal heart sounds. No murmur heard. Pulmonary:     Effort: Pulmonary effort is normal.     Breath sounds: Normal breath sounds. No  wheezing or rales.  Musculoskeletal:     Right lower leg: No edema.     Left lower leg: No edema.        Assessment & Recommendations:    80 y/o Caucasian female with hypertension, hyperlipidemia, prediabetes, mild nonobstructive CAD  Leg pain:  More at rest without any clear claudication. I suspect her rest pain, along with "crawling sensation" could be due to neuropathy. While she does have PAD, as on my exam and previous ABI, her symptoms are not consistent with PAD. I will obtain LEA duplex to rule out severe PAD. I prescribed prn lasix for leg edema.  Recommend f/u w/PCP. Scheduled to see Dr. Theda Sers next week.   CAD:  Nonobstructive. Continue Aspirin, statin  Hypertension: Blood pressure elevated today. Generally well controlled. Has had a lot of stressors lately. Recheck at PCP visit next week.   Mixed hyperlipidemia: Well controlled  F/u in 6 months  Tavius Turgeon [name], MD [facility] Pager: [phone] Office: [phone] If no answer Cell [phone]

## 2021-01-07 ENCOUNTER — Other Ambulatory Visit: Payer: Self-pay

## 2021-01-07 ENCOUNTER — Ambulatory Visit: Payer: PPO

## 2021-01-07 DIAGNOSIS — R0989 Other specified symptoms and signs involving the circulatory and respiratory systems: Secondary | ICD-10-CM

## 2021-01-08 DIAGNOSIS — E039 Hypothyroidism, unspecified: Secondary | ICD-10-CM | POA: Diagnosis not present

## 2021-01-08 DIAGNOSIS — I129 Hypertensive chronic kidney disease with stage 1 through stage 4 chronic kidney disease, or unspecified chronic kidney disease: Secondary | ICD-10-CM | POA: Diagnosis not present

## 2021-01-08 DIAGNOSIS — E119 Type 2 diabetes mellitus without complications: Secondary | ICD-10-CM | POA: Diagnosis not present

## 2021-01-15 DIAGNOSIS — E559 Vitamin D deficiency, unspecified: Secondary | ICD-10-CM | POA: Diagnosis not present

## 2021-01-15 DIAGNOSIS — E119 Type 2 diabetes mellitus without complications: Secondary | ICD-10-CM | POA: Diagnosis not present

## 2021-01-15 DIAGNOSIS — I129 Hypertensive chronic kidney disease with stage 1 through stage 4 chronic kidney disease, or unspecified chronic kidney disease: Secondary | ICD-10-CM | POA: Diagnosis not present

## 2021-01-15 DIAGNOSIS — M5136 Other intervertebral disc degeneration, lumbar region: Secondary | ICD-10-CM | POA: Diagnosis not present

## 2021-01-15 DIAGNOSIS — E039 Hypothyroidism, unspecified: Secondary | ICD-10-CM | POA: Diagnosis not present

## 2021-01-15 DIAGNOSIS — N1832 Chronic kidney disease, stage 3b: Secondary | ICD-10-CM | POA: Diagnosis not present

## 2021-01-15 DIAGNOSIS — E785 Hyperlipidemia, unspecified: Secondary | ICD-10-CM | POA: Diagnosis not present

## 2021-01-15 DIAGNOSIS — Z8739 Personal history of other diseases of the musculoskeletal system and connective tissue: Secondary | ICD-10-CM | POA: Diagnosis not present

## 2021-01-26 ENCOUNTER — Ambulatory Visit: Payer: PPO | Admitting: Cardiology

## 2021-02-04 DIAGNOSIS — E119 Type 2 diabetes mellitus without complications: Secondary | ICD-10-CM | POA: Diagnosis not present

## 2021-02-09 ENCOUNTER — Other Ambulatory Visit: Payer: Self-pay | Admitting: Cardiology

## 2021-02-09 DIAGNOSIS — E785 Hyperlipidemia, unspecified: Secondary | ICD-10-CM

## 2021-02-16 ENCOUNTER — Other Ambulatory Visit: Payer: Self-pay | Admitting: Cardiology

## 2021-02-16 DIAGNOSIS — I10 Essential (primary) hypertension: Secondary | ICD-10-CM

## 2021-02-25 DIAGNOSIS — E119 Type 2 diabetes mellitus without complications: Secondary | ICD-10-CM | POA: Diagnosis not present

## 2021-02-25 DIAGNOSIS — I129 Hypertensive chronic kidney disease with stage 1 through stage 4 chronic kidney disease, or unspecified chronic kidney disease: Secondary | ICD-10-CM | POA: Diagnosis not present

## 2021-02-25 DIAGNOSIS — E039 Hypothyroidism, unspecified: Secondary | ICD-10-CM | POA: Diagnosis not present

## 2021-02-25 DIAGNOSIS — E785 Hyperlipidemia, unspecified: Secondary | ICD-10-CM | POA: Diagnosis not present

## 2021-02-27 DIAGNOSIS — E119 Type 2 diabetes mellitus without complications: Secondary | ICD-10-CM | POA: Diagnosis not present

## 2021-02-27 DIAGNOSIS — E7841 Elevated Lipoprotein(a): Secondary | ICD-10-CM | POA: Diagnosis not present

## 2021-02-27 DIAGNOSIS — I129 Hypertensive chronic kidney disease with stage 1 through stage 4 chronic kidney disease, or unspecified chronic kidney disease: Secondary | ICD-10-CM | POA: Diagnosis not present

## 2021-02-27 DIAGNOSIS — N1832 Chronic kidney disease, stage 3b: Secondary | ICD-10-CM | POA: Diagnosis not present

## 2021-03-18 DIAGNOSIS — E119 Type 2 diabetes mellitus without complications: Secondary | ICD-10-CM | POA: Diagnosis not present

## 2021-03-18 DIAGNOSIS — N1832 Chronic kidney disease, stage 3b: Secondary | ICD-10-CM | POA: Diagnosis not present

## 2021-03-18 DIAGNOSIS — E785 Hyperlipidemia, unspecified: Secondary | ICD-10-CM | POA: Diagnosis not present

## 2021-03-30 ENCOUNTER — Other Ambulatory Visit: Payer: Self-pay | Admitting: Cardiology

## 2021-03-30 DIAGNOSIS — R600 Localized edema: Secondary | ICD-10-CM

## 2021-05-22 DIAGNOSIS — E119 Type 2 diabetes mellitus without complications: Secondary | ICD-10-CM | POA: Diagnosis not present

## 2021-05-22 DIAGNOSIS — E039 Hypothyroidism, unspecified: Secondary | ICD-10-CM | POA: Diagnosis not present

## 2021-05-22 DIAGNOSIS — E785 Hyperlipidemia, unspecified: Secondary | ICD-10-CM | POA: Diagnosis not present

## 2021-05-26 DIAGNOSIS — E119 Type 2 diabetes mellitus without complications: Secondary | ICD-10-CM | POA: Diagnosis not present

## 2021-05-26 DIAGNOSIS — I10 Essential (primary) hypertension: Secondary | ICD-10-CM | POA: Diagnosis not present

## 2021-05-26 DIAGNOSIS — D649 Anemia, unspecified: Secondary | ICD-10-CM | POA: Diagnosis not present

## 2021-05-26 DIAGNOSIS — E785 Hyperlipidemia, unspecified: Secondary | ICD-10-CM | POA: Diagnosis not present

## 2021-05-26 DIAGNOSIS — E039 Hypothyroidism, unspecified: Secondary | ICD-10-CM | POA: Diagnosis not present

## 2021-06-19 ENCOUNTER — Other Ambulatory Visit: Payer: Self-pay

## 2021-06-19 DIAGNOSIS — E785 Hyperlipidemia, unspecified: Secondary | ICD-10-CM

## 2021-06-19 MED ORDER — ROSUVASTATIN CALCIUM 20 MG PO TABS: 20.0000 mg | ORAL_TABLET | Freq: Every day | ORAL | 1 refills | Status: DC

## 2021-07-02 ENCOUNTER — Ambulatory Visit: Payer: PPO | Admitting: Cardiology

## 2021-07-07 DIAGNOSIS — D649 Anemia, unspecified: Secondary | ICD-10-CM | POA: Diagnosis not present

## 2021-07-07 DIAGNOSIS — E119 Type 2 diabetes mellitus without complications: Secondary | ICD-10-CM | POA: Diagnosis not present

## 2021-07-07 DIAGNOSIS — E039 Hypothyroidism, unspecified: Secondary | ICD-10-CM | POA: Diagnosis not present

## 2021-07-21 NOTE — Progress Notes (Unsigned)
Patient is here for follow up visit.  Subjective:   @Patient  ID: Angel Day, female    DOB: Jan 02, 1941, 81 y.o.   MRN: [id number]   No chief complaint on file.   HPI  81 y/o Caucasian female with hypertension, hyperlipidemia, prediabetes, mild nonobstructive CAD  Patient has recently noticed symptoms of "crawling sensation" on her legs and feet, along with pain at rest. She denies having significant calf or thigh pain with walking 1 mile, as she does so regularly.     Current Outpatient Medications:    amLODipine (NORVASC) 5 MG tablet, TAKE 1 TABLET BY MOUTH EVERY DAY, Disp: 90 tablet, Rfl: 3   aspirin 81 MG tablet, Take 81 mg by mouth. Sometimes, Disp: , Rfl:    furosemide (LASIX) 20 MG tablet, TAKE 1 TABLET BY MOUTH EVERY DAY AS NEEDED, Disp: 90 tablet, Rfl: 1   levothyroxine (SYNTHROID, LEVOTHROID) 50 MCG tablet, Take 50 mcg by mouth daily., Disp: , Rfl: 5   losartan (COZAAR) 100 MG tablet, Take 100 mg by mouth daily., Disp: , Rfl: 10   rosuvastatin (CRESTOR) 20 MG tablet, Take 1 tablet (20 mg total) by mouth daily., Disp: 90 tablet, Rfl: 1  Cardiovascular studies:  EKG 01/02/2021: Sinus rhythm 74 bpm Nonspecific T-abnormality  Echocardiogram 06/05/2020:  Left ventricle cavity is normal in size and wall thickness. Normal global  wall motion. Normal LV systolic function with EF 57%. Indeterminate  diastolic filling pattern.  Mild (Grade I) mitral regurgitation.  Mild to moderate tricuspid regurgitation. Estimated pulmonary artery  systolic pressure 29 mmHg.  No significant change compared to previous study on 07/27/2018.  Exercise Myoview stress test 09/17/2019: Normal myocardial perfusion. Stress LVEF 65%. Exercise nuclear stress test was performed using Bruce protocol. Patient reached 7 METS, and 85% of age predicted maximum heart rate. Exercise capacity was low. Chest pain not reported. Heart rate and hemodynamic response were normal. Stress EKG revealed no  ischemic changes. Normal myocardial perfusion. Stress LVEF 65%. Low risk study,  Echocardiogram 07/27/2018: Normal LV systolic function with EF 63%. Left ventricle cavity is normal in size. Normal global wall motion. Doppler evidence of grade I (impaired) diastolic dysfunction, normal LAP. Calculated EF 63%. Left atrial cavity is mildly dilated. Mild (Grade I) mitral regurgitation. Mild to moderate tricuspid regurgitation. Estimated pulmonary artery systolic pressure 29 mmHg.  ABI 07/27/2018:  This exam reveals normal perfusion of the right lower extremity (ABI  1.00). Normal triphasic spectral waveforms of the right ankle.  Mildly decreased perfusion of the left lower extremity, noted at the  anterior tibial and post tibial artery level (ABI 0.96). Mildly abnormal  spectral waveforms of the left ankle.  CTA cor 07/12/2018: Coronaries: Ca score 309 (73rd percentile) LM: Calcified plaque at the ostium. No significant stenosis. LAD: Mixed plaque proximal and mid LAD, mild (<50%) stenosis.  LCx: Mixed plaque in mid LCx, minimal stenosis. RCA: Mixed plaque proximal and mid RCA with minimal stenosis.   Pulmonary veins drain normally to the left atrium.   IMPRESSION: 1. Coronary artery calcium score 309 Agatston units. This places the patient in the 73rd percentile for age and gender, suggesting intermediate risk for future cardiac events. 2.  Nonobstructive coronary disease. 3. Aortic atherosclerosis. 4. Small hiatal hernia.   Exercise myoview stress 03/22/2018:  1. The patient performed treadmill exercise using Bruce protocol, completing 5:01 minutes. The patient completed an estimated workload of 7 METS, reaching 86% of the maximum predicted heart rate. Exercise capacity was low. Hemodynamic response  was normal. Stress symptoms included exercise induced chest pain, relieved with rest.  Stress electrocardiogram was positive for ischemia, showing 1.5-2 mm upsloping ST depressions in  leads II, III, aVF. ST-T changes returned to baseline 2 min into recovery. 2. The overall quality of the study is good. There is no evidence of abnormal lung activity. Stress and rest SPECT images demonstrate homogeneous tracer distribution throughout the myocardium. Gated SPECT imaging reveals normal myocardial thickening and wall motion. The left ventricular ejection fraction was normal (57%).   3. Intermediate risk study due to EKG changes and exercise induced chest pain. Clinical correlation recommended.  EKG 03/17/2018: Sinus rhythm 73 bpm. Normal axis. Normal conduction. Normal EKG.  Recent labs: 05/26/2021: Glucose 98, BUN/Cr 36/1.3. EGFR 37. Na/K 143/5.1. Rest of the CMP normal H/H 13/39. MCV 89. Platelets 284 HbA1C 6.0% Chol 119, TG 145, HDL 40, LDL 50 TSH 5.1 normal  10/30/2019: Glucose 130, BUN/Cr 28/1.0. EGFR 51. HbA1C 6.6% Chol 127, TG 165, HDL 41, LDL 58 TSH 4.3 normal  Jan-Apr 2021: Glucose 1054, BUN/Cr 24/1.0. EGFR 53. HbA1C 6.4% Chol 116, TG 145, HDL 41, LDL 43 TSH 2.6 normal  01/13/2018:  12/2017: Glucose 124, Cr 0.94. EGFR 58. Na/K 141/5.2. 12Rest of the CMP normal H/H 12/37. MCV 94. Platelets 302 HbA1C 6.1% Chol 206, TG 197, HDL 43, LDL 124   Review of Systems  Constitutional: Negative for malaise/fatigue.  Cardiovascular:  Negative for chest pain, dyspnea on exertion, leg swelling, palpitations and syncope.  Neurological:  Positive for paresthesias.       Objective:    There were no vitals filed for this visit.     Physical Exam Vitals and nursing note reviewed.  Constitutional:      General: She is not in acute distress. Neck:     Vascular: No JVD.  Cardiovascular:     Rate and Rhythm: Normal rate and regular rhythm.     Pulses: Intact distal pulses.          Dorsalis pedis pulses are 1+ on the right side and 1+ on the left side.       Posterior tibial pulses are 2+ on the right side and 0 on the left side.     Heart sounds: Normal heart  sounds. No murmur heard. Pulmonary:     Effort: Pulmonary effort is normal.     Breath sounds: Normal breath sounds. No wheezing or rales.  Musculoskeletal:     Right lower leg: No edema.     Left lower leg: No edema.        Assessment & Recommendations:    81 y/o Caucasian female with hypertension, hyperlipidemia, prediabetes, mild nonobstructive CAD  ***Leg pain:  More at rest without any clear claudication. I suspect her rest pain, along with "crawling sensation" could be due to neuropathy. While she does have PAD, as on my exam and previous ABI, her symptoms are not consistent with PAD. I will obtain LEA duplex to rule out severe PAD. I prescribed prn lasix for leg edema.  Recommend f/u w/PCP. Scheduled to see Dr. Theda Sers next week.   CAD:  Nonobstructive. Continue Aspirin, statin  Hypertension: Blood pressure elevated today. Generally well controlled. Has had a lot of stressors lately. Recheck at PCP visit next week.   Mixed hyperlipidemia: Well controlled  ***F/u in 6 months  Josselin Gaulin [name], MD [facility] Pager: [phone] Office: [phone] If no answer Cell [phone]

## 2021-07-22 ENCOUNTER — Encounter: Payer: Self-pay | Admitting: Cardiology

## 2021-07-22 ENCOUNTER — Ambulatory Visit: Payer: PPO | Admitting: Cardiology

## 2021-07-22 VITALS — BP 128/60 | HR 61 | Temp 98.0°F | Resp 16 | Ht 64.0 in | Wt 135.0 lb

## 2021-07-22 DIAGNOSIS — I10 Essential (primary) hypertension: Secondary | ICD-10-CM | POA: Diagnosis not present

## 2021-07-22 DIAGNOSIS — D72829 Elevated white blood cell count, unspecified: Secondary | ICD-10-CM | POA: Diagnosis not present

## 2021-07-22 DIAGNOSIS — I129 Hypertensive chronic kidney disease with stage 1 through stage 4 chronic kidney disease, or unspecified chronic kidney disease: Secondary | ICD-10-CM | POA: Diagnosis not present

## 2021-07-22 DIAGNOSIS — E1122 Type 2 diabetes mellitus with diabetic chronic kidney disease: Secondary | ICD-10-CM | POA: Diagnosis not present

## 2021-07-22 DIAGNOSIS — N1832 Chronic kidney disease, stage 3b: Secondary | ICD-10-CM | POA: Diagnosis not present

## 2021-07-22 DIAGNOSIS — Z0000 Encounter for general adult medical examination without abnormal findings: Secondary | ICD-10-CM | POA: Diagnosis not present

## 2021-07-22 DIAGNOSIS — I2510 Atherosclerotic heart disease of native coronary artery without angina pectoris: Secondary | ICD-10-CM | POA: Diagnosis not present

## 2021-07-22 DIAGNOSIS — E559 Vitamin D deficiency, unspecified: Secondary | ICD-10-CM | POA: Diagnosis not present

## 2021-07-22 DIAGNOSIS — E782 Mixed hyperlipidemia: Secondary | ICD-10-CM | POA: Diagnosis not present

## 2021-07-22 DIAGNOSIS — E785 Hyperlipidemia, unspecified: Secondary | ICD-10-CM | POA: Diagnosis not present

## 2021-07-22 DIAGNOSIS — E039 Hypothyroidism, unspecified: Secondary | ICD-10-CM | POA: Diagnosis not present

## 2021-10-23 ENCOUNTER — Ambulatory Visit: Payer: PPO | Admitting: Cardiology

## 2021-10-23 ENCOUNTER — Encounter: Payer: Self-pay | Admitting: Cardiology

## 2021-10-23 VITALS — BP 122/65 | HR 69 | Temp 98.0°F | Resp 16 | Ht 64.0 in | Wt 133.0 lb

## 2021-10-23 DIAGNOSIS — I10 Essential (primary) hypertension: Secondary | ICD-10-CM | POA: Diagnosis not present

## 2021-10-23 DIAGNOSIS — I25118 Atherosclerotic heart disease of native coronary artery with other forms of angina pectoris: Secondary | ICD-10-CM

## 2021-10-23 DIAGNOSIS — E782 Mixed hyperlipidemia: Secondary | ICD-10-CM | POA: Diagnosis not present

## 2021-10-23 NOTE — Progress Notes (Signed)
Patient is here for follow up visit.  Subjective:   @Patient  ID: Angel Day, female    DOB: Oct 06, 1940, 81 y.o.   MRN: [id number]   Chief Complaint  Patient presents with   Coronary Artery Disease   Follow-up    3 month    HPI  81 y/o Caucasian female with hypertension, hyperlipidemia, prediabetes, mild nonobstructive CAD  She walks about a mile without any significant chest pain, but continues to have chest tightness with mental stress. Blood pressure has improved with weight loss.     Current Outpatient Medications:    amLODipine (NORVASC) 5 MG tablet, TAKE 1 TABLET BY MOUTH EVERY DAY, Disp: 90 tablet, Rfl: 3   aspirin 81 MG tablet, Take 81 mg by mouth. Sometimes, Disp: , Rfl:    furosemide (LASIX) 20 MG tablet, TAKE 1 TABLET BY MOUTH EVERY DAY AS NEEDED, Disp: 90 tablet, Rfl: 1   levothyroxine (SYNTHROID, LEVOTHROID) 50 MCG tablet, Take 50 mcg by mouth daily., Disp: , Rfl: 5   losartan (COZAAR) 100 MG tablet, Take 100 mg by mouth daily., Disp: , Rfl: 10   rosuvastatin (CRESTOR) 20 MG tablet, Take 1 tablet (20 mg total) by mouth daily., Disp: 90 tablet, Rfl: 1  Cardiovascular studies:  EKG 07/22/2021: Sinus rhythm 57 bpm Possible old anteroseptal infarct  Echocardiogram 06/05/2020:  Left ventricle cavity is normal in size and wall thickness. Normal global  wall motion. Normal LV systolic function with EF 57%. Indeterminate  diastolic filling pattern.  Mild (Grade I) mitral regurgitation.  Mild to moderate tricuspid regurgitation. Estimated pulmonary artery  systolic pressure 29 mmHg.  No significant change compared to previous study on 07/27/2018.  Exercise Myoview stress test 09/17/2019: Normal myocardial perfusion. Stress LVEF 65%. Exercise nuclear stress test was performed using Bruce protocol. Patient reached 7 METS, and 85% of age predicted maximum heart rate. Exercise capacity was low. Chest pain not reported. Heart rate and hemodynamic response were  normal. Stress EKG revealed no ischemic changes. Normal myocardial perfusion. Stress LVEF 65%. Low risk study,  ABI 07/27/2018:  This exam reveals normal perfusion of the right lower extremity (ABI  1.00). Normal triphasic spectral waveforms of the right ankle.  Mildly decreased perfusion of the left lower extremity, noted at the  anterior tibial and post tibial artery level (ABI 0.96). Mildly abnormal  spectral waveforms of the left ankle.  CTA cor 07/12/2018: Coronaries: Ca score 309 (73rd percentile) LM: Calcified plaque at the ostium. No significant stenosis. LAD: Mixed plaque proximal and mid LAD, mild (<50%) stenosis.  LCx: Mixed plaque in mid LCx, minimal stenosis. RCA: Mixed plaque proximal and mid RCA with minimal stenosis.   Pulmonary veins drain normally to the left atrium.   IMPRESSION: 1. Coronary artery calcium score 309 Agatston units. This places the patient in the 73rd percentile for age and gender, suggesting intermediate risk for future cardiac events. 2.  Nonobstructive coronary disease. 3. Aortic atherosclerosis. 4. Small hiatal hernia.   Exercise myoview stress 03/22/2018:  1. The patient performed treadmill exercise using Bruce protocol, completing 5:01 minutes. The patient completed an estimated workload of 7 METS, reaching 86% of the maximum predicted heart rate. Exercise capacity was low. Hemodynamic response was normal. Stress symptoms included exercise induced chest pain, relieved with rest.  Stress electrocardiogram was positive for ischemia, showing 1.5-2 mm upsloping ST depressions in leads II, III, aVF. ST-T changes returned to baseline 2 min into recovery. 2. The overall quality of the study is good. There  is no evidence of abnormal lung activity. Stress and rest SPECT images demonstrate homogeneous tracer distribution throughout the myocardium. Gated SPECT imaging reveals normal myocardial thickening and wall motion. The left ventricular ejection  fraction was normal (57%).   3. Intermediate risk study due to EKG changes and exercise induced chest pain. Clinical correlation recommended.  EKG 03/17/2018: Sinus rhythm 73 bpm. Normal axis. Normal conduction. Normal EKG.  Recent labs: 05/26/2021: Glucose 98, BUN/Cr 36/1.3. EGFR 37. Na/K 143/5.1. Rest of the CMP normal H/H 13/39. MCV 89. Platelets 284 HbA1C 6.0% Chol 119, TG 145, HDL 40, LDL 50 TSH 5.1 normal   Review of Systems  Constitutional: Negative for malaise/fatigue.  Cardiovascular:  Positive for chest pain. Negative for dyspnea on exertion, leg swelling, palpitations and syncope.  Neurological:  Positive for paresthesias.        Objective:    Vitals:   10/23/21 1415  BP: 122/65  Pulse: 69  Resp: 16  Temp: 98 F (36.7 C)  SpO2: 95%      Physical Exam Vitals and nursing note reviewed.  Constitutional:      General: She is not in acute distress. Neck:     Vascular: No JVD.  Cardiovascular:     Rate and Rhythm: Normal rate and regular rhythm.     Pulses: Intact distal pulses.          Dorsalis pedis pulses are 1+ on the right side and 1+ on the left side.       Posterior tibial pulses are 2+ on the right side and 0 on the left side.     Heart sounds: Normal heart sounds. No murmur heard. Pulmonary:     Effort: Pulmonary effort is normal.     Breath sounds: Normal breath sounds. No wheezing or rales.  Musculoskeletal:     Right lower leg: No edema.     Left lower leg: No edema.         Assessment & Recommendations:    81 y/o Caucasian female with hypertension, hyperlipidemia, prediabetes, mild nonobstructive CAD  CAD:  Continued chest tightness episodes. Will obtain exercise nuclear stress test. Nonobstructive. Continue Aspirin, statin Consider using Jardiance for CV benefits. Reportedly was on it before but was discontinued by endocrinology.  Hypertension: Controlled  Mixed hyperlipidemia: Well controlled  F/u in 3 months  Angel Morden [name], MD [facility] Pager: [phone] Office: [phone] If no answer Cell [phone]

## 2021-11-13 DIAGNOSIS — N1832 Chronic kidney disease, stage 3b: Secondary | ICD-10-CM | POA: Diagnosis not present

## 2021-11-13 DIAGNOSIS — I129 Hypertensive chronic kidney disease with stage 1 through stage 4 chronic kidney disease, or unspecified chronic kidney disease: Secondary | ICD-10-CM | POA: Diagnosis not present

## 2021-11-13 DIAGNOSIS — D631 Anemia in chronic kidney disease: Secondary | ICD-10-CM | POA: Diagnosis not present

## 2021-11-13 DIAGNOSIS — N183 Chronic kidney disease, stage 3 unspecified: Secondary | ICD-10-CM | POA: Diagnosis not present

## 2021-11-13 DIAGNOSIS — E785 Hyperlipidemia, unspecified: Secondary | ICD-10-CM | POA: Diagnosis not present

## 2021-11-13 DIAGNOSIS — N390 Urinary tract infection, site not specified: Secondary | ICD-10-CM | POA: Diagnosis not present

## 2021-11-13 DIAGNOSIS — N2581 Secondary hyperparathyroidism of renal origin: Secondary | ICD-10-CM | POA: Diagnosis not present

## 2021-11-16 ENCOUNTER — Ambulatory Visit: Payer: PPO

## 2021-11-16 DIAGNOSIS — I25118 Atherosclerotic heart disease of native coronary artery with other forms of angina pectoris: Secondary | ICD-10-CM | POA: Diagnosis not present

## 2021-11-17 ENCOUNTER — Other Ambulatory Visit: Payer: Self-pay | Admitting: Nephrology

## 2021-11-17 DIAGNOSIS — N183 Chronic kidney disease, stage 3 unspecified: Secondary | ICD-10-CM

## 2021-11-24 ENCOUNTER — Other Ambulatory Visit: Payer: PPO

## 2021-12-01 ENCOUNTER — Ambulatory Visit
Admission: RE | Admit: 2021-12-01 | Discharge: 2021-12-01 | Disposition: A | Discharge status: Home / Self Care | Payer: PPO | Source: Ambulatory Visit | Attending: Nephrology | Admitting: Nephrology

## 2021-12-01 DIAGNOSIS — K828 Other specified diseases of gallbladder: Secondary | ICD-10-CM | POA: Diagnosis not present

## 2021-12-01 DIAGNOSIS — N3289 Other specified disorders of bladder: Secondary | ICD-10-CM | POA: Diagnosis not present

## 2021-12-01 DIAGNOSIS — N183 Chronic kidney disease, stage 3 unspecified: Secondary | ICD-10-CM

## 2021-12-01 DIAGNOSIS — N189 Chronic kidney disease, unspecified: Secondary | ICD-10-CM | POA: Diagnosis not present

## 2021-12-08 DIAGNOSIS — E1122 Type 2 diabetes mellitus with diabetic chronic kidney disease: Secondary | ICD-10-CM | POA: Diagnosis not present

## 2021-12-08 DIAGNOSIS — I10 Essential (primary) hypertension: Secondary | ICD-10-CM | POA: Diagnosis not present

## 2021-12-08 DIAGNOSIS — E039 Hypothyroidism, unspecified: Secondary | ICD-10-CM | POA: Diagnosis not present

## 2021-12-08 DIAGNOSIS — E785 Hyperlipidemia, unspecified: Secondary | ICD-10-CM | POA: Diagnosis not present

## 2021-12-21 ENCOUNTER — Other Ambulatory Visit: Payer: Self-pay | Admitting: Cardiology

## 2021-12-21 DIAGNOSIS — N183 Chronic kidney disease, stage 3 unspecified: Secondary | ICD-10-CM | POA: Diagnosis not present

## 2021-12-21 DIAGNOSIS — E785 Hyperlipidemia, unspecified: Secondary | ICD-10-CM

## 2021-12-28 DIAGNOSIS — N329 Bladder disorder, unspecified: Secondary | ICD-10-CM | POA: Diagnosis not present

## 2022-01-15 DIAGNOSIS — H2513 Age-related nuclear cataract, bilateral: Secondary | ICD-10-CM | POA: Diagnosis not present

## 2022-01-15 DIAGNOSIS — E119 Type 2 diabetes mellitus without complications: Secondary | ICD-10-CM | POA: Diagnosis not present

## 2022-01-15 DIAGNOSIS — H43393 Other vitreous opacities, bilateral: Secondary | ICD-10-CM | POA: Diagnosis not present

## 2022-01-25 ENCOUNTER — Encounter: Payer: Self-pay | Admitting: Cardiology

## 2022-01-25 ENCOUNTER — Ambulatory Visit: Payer: PPO | Admitting: Cardiology

## 2022-01-25 VITALS — BP 140/47 | HR 58 | Resp 16 | Ht 63.0 in | Wt 132.0 lb

## 2022-01-25 DIAGNOSIS — I25118 Atherosclerotic heart disease of native coronary artery with other forms of angina pectoris: Secondary | ICD-10-CM

## 2022-01-25 DIAGNOSIS — E782 Mixed hyperlipidemia: Secondary | ICD-10-CM

## 2022-01-25 DIAGNOSIS — I10 Essential (primary) hypertension: Secondary | ICD-10-CM

## 2022-01-25 NOTE — Progress Notes (Signed)
Patient is here for follow up visit.  Subjective:   _0  ID: Angel Day, female    DOB: [birth year], 81 y.o.   MRN: [id number]   Chief Complaint  Patient presents with   Coronary Artery Disease   Chest Pain   Follow-up    3 month    HPI  81 y/o Caucasian female with hypertension, hyperlipidemia, prediabetes, mild nonobstructive CAD, CKD stage 3  Reviewed recent test results with the patient, details below. Patient had an episode of chest tightness after thanksgiving meal, that got better with PPI. She otherwise has no symptoms of chest pain, dyspnea. Blood pressure slightly elevated today, generally well controlled.    Current Outpatient Medications:    aspirin 81 MG tablet, Take 81 mg by mouth. Sometimes, Disp: , Rfl:    furosemide (LASIX) 20 MG tablet, TAKE 1 TABLET BY MOUTH EVERY DAY AS NEEDED, Disp: 90 tablet, Rfl: 1   levothyroxine (SYNTHROID, LEVOTHROID) 50 MCG tablet, Take 50 mcg by mouth daily., Disp: , Rfl: 5   losartan (COZAAR) 100 MG tablet, Take 100 mg by mouth daily., Disp: , Rfl: 10   rosuvastatin (CRESTOR) 20 MG tablet, TAKE 1 TABLET BY MOUTH EVERY DAY, Disp: 90 tablet, Rfl: 1  Cardiovascular studies: EKG 01/25/2022: Sinus rhythm 57 bpm Low voltage in precordial leads Old anteroseptal infarct Nonspecific T-abnormality  Exercise Tetrofosmin stress test 11/16/2021: Exercise nuclear stress test was performed using Bruce protocol. Patient reached 7 METS, and 85% of age predicted maximum heart rate. Exercise capacity was low normal. No chest pain reported. Heart rate and hemodynamic response were normal. Stress EKG revealed no ischemic changes. Normal myocardial perfusion. Stress LVEF 59%. Low risk study. No significant change compared to previous study on 7/19/20221.  Echocardiogram 06/05/2020:  Left ventricle cavity is normal in size and wall thickness. Normal global  wall motion. Normal LV systolic function with EF 57%. Indeterminate  diastolic  filling pattern.  Mild (Grade I) mitral regurgitation.  Mild to moderate tricuspid regurgitation. Estimated pulmonary artery  systolic pressure 29 mmHg.  No significant change compared to previous study on 07/27/2018.  ABI 07/27/2018:  This exam reveals normal perfusion of the right lower extremity (ABI  1.00). Normal triphasic spectral waveforms of the right ankle.  Mildly decreased perfusion of the left lower extremity, noted at the  anterior tibial and post tibial artery level (ABI 0.96). Mildly abnormal  spectral waveforms of the left ankle.  CTA cor 07/12/2018: Coronaries: Ca score 309 (73rd percentile) LM: Calcified plaque at the ostium. No significant stenosis. LAD: Mixed plaque proximal and mid LAD, mild (<50%) stenosis.  LCx: Mixed plaque in mid LCx, minimal stenosis. RCA: Mixed plaque proximal and mid RCA with minimal stenosis.   Pulmonary veins drain normally to the left atrium.   IMPRESSION: 1. Coronary artery calcium score 309 Agatston units. This places the patient in the 73rd percentile for age and gender, suggesting intermediate risk for future cardiac events. 2.  Nonobstructive coronary disease. 3. Aortic atherosclerosis. 4. Small hiatal hernia.   Recent labs: 05/26/2021: Glucose 98, BUN/Cr 36/1.3. EGFR 37. Na/K 143/5.1. Rest of the CMP normal H/H 13/39. MCV 89. Platelets 284 HbA1C 6.0% Chol 119, TG 145, HDL 40, LDL 50 TSH 5.1 normal   Review of Systems  Constitutional: Negative for malaise/fatigue.  Cardiovascular:  Positive for chest pain. Negative for dyspnea on exertion, leg swelling, palpitations and syncope.  Neurological:  Positive for paresthesias.        Objective:    Vitals:  01/25/22 1044  BP: (!) 140/47  Pulse: (!) 58  Resp: 16  SpO2: 96%      Physical Exam Vitals and nursing note reviewed.  Constitutional:      General: She is not in acute distress. Neck:     Vascular: No JVD.  Cardiovascular:     Rate and Rhythm: Normal  rate and regular rhythm.     Pulses: Intact distal pulses.          Dorsalis pedis pulses are 1+ on the right side and 1+ on the left side.       Posterior tibial pulses are 2+ on the right side and 0 on the left side.     Heart sounds: Normal heart sounds. No murmur heard. Pulmonary:     Effort: Pulmonary effort is normal.     Breath sounds: Normal breath sounds. No wheezing or rales.  Musculoskeletal:     Right lower leg: No edema.     Left lower leg: No edema.         Assessment & Recommendations:    81 y/o Caucasian female with hypertension, hyperlipidemia, prediabetes, mild nonobstructive CAD, CKD stage 3  CAD:  No ischemia on tress testing (10/2021). Nonobstructive. Continue Aspirin, statin. Now, also on Jardiance.  Hypertension: Generally well controlled.d monitor for now. If SBP consistently >140 mmHg, could add amlodipine.  Mixed hyperlipidemia: Well controlled  F/u in 6 months   Nigel Mormon, MD Pager: [phone] Office: [phone]

## 2022-05-13 DIAGNOSIS — N2581 Secondary hyperparathyroidism of renal origin: Secondary | ICD-10-CM | POA: Diagnosis not present

## 2022-05-13 DIAGNOSIS — D631 Anemia in chronic kidney disease: Secondary | ICD-10-CM | POA: Diagnosis not present

## 2022-05-13 DIAGNOSIS — N3289 Other specified disorders of bladder: Secondary | ICD-10-CM | POA: Diagnosis not present

## 2022-05-13 DIAGNOSIS — N183 Chronic kidney disease, stage 3 unspecified: Secondary | ICD-10-CM | POA: Diagnosis not present

## 2022-05-13 DIAGNOSIS — I129 Hypertensive chronic kidney disease with stage 1 through stage 4 chronic kidney disease, or unspecified chronic kidney disease: Secondary | ICD-10-CM | POA: Diagnosis not present

## 2022-06-09 DIAGNOSIS — I10 Essential (primary) hypertension: Secondary | ICD-10-CM | POA: Diagnosis not present

## 2022-06-09 DIAGNOSIS — E039 Hypothyroidism, unspecified: Secondary | ICD-10-CM | POA: Diagnosis not present

## 2022-06-09 DIAGNOSIS — E785 Hyperlipidemia, unspecified: Secondary | ICD-10-CM | POA: Diagnosis not present

## 2022-06-09 DIAGNOSIS — E1122 Type 2 diabetes mellitus with diabetic chronic kidney disease: Secondary | ICD-10-CM | POA: Diagnosis not present

## 2022-06-26 ENCOUNTER — Other Ambulatory Visit: Payer: Self-pay | Admitting: Cardiology

## 2022-06-26 DIAGNOSIS — E785 Hyperlipidemia, unspecified: Secondary | ICD-10-CM

## 2022-07-20 DIAGNOSIS — E039 Hypothyroidism, unspecified: Secondary | ICD-10-CM | POA: Diagnosis not present

## 2022-07-20 DIAGNOSIS — E785 Hyperlipidemia, unspecified: Secondary | ICD-10-CM | POA: Diagnosis not present

## 2022-07-20 DIAGNOSIS — N1832 Chronic kidney disease, stage 3b: Secondary | ICD-10-CM | POA: Diagnosis not present

## 2022-07-20 DIAGNOSIS — E1122 Type 2 diabetes mellitus with diabetic chronic kidney disease: Secondary | ICD-10-CM | POA: Diagnosis not present

## 2022-07-20 DIAGNOSIS — I129 Hypertensive chronic kidney disease with stage 1 through stage 4 chronic kidney disease, or unspecified chronic kidney disease: Secondary | ICD-10-CM | POA: Diagnosis not present

## 2022-07-22 ENCOUNTER — Ambulatory Visit: Payer: PPO | Admitting: Cardiology

## 2022-07-27 DIAGNOSIS — N1831 Chronic kidney disease, stage 3a: Secondary | ICD-10-CM | POA: Diagnosis not present

## 2022-07-27 DIAGNOSIS — E039 Hypothyroidism, unspecified: Secondary | ICD-10-CM | POA: Diagnosis not present

## 2022-07-27 DIAGNOSIS — E785 Hyperlipidemia, unspecified: Secondary | ICD-10-CM | POA: Diagnosis not present

## 2022-07-27 DIAGNOSIS — E1122 Type 2 diabetes mellitus with diabetic chronic kidney disease: Secondary | ICD-10-CM | POA: Diagnosis not present

## 2022-07-27 DIAGNOSIS — Z0000 Encounter for general adult medical examination without abnormal findings: Secondary | ICD-10-CM | POA: Diagnosis not present

## 2022-07-27 DIAGNOSIS — M5136 Other intervertebral disc degeneration, lumbar region: Secondary | ICD-10-CM | POA: Diagnosis not present

## 2022-07-27 DIAGNOSIS — I129 Hypertensive chronic kidney disease with stage 1 through stage 4 chronic kidney disease, or unspecified chronic kidney disease: Secondary | ICD-10-CM | POA: Diagnosis not present

## 2022-07-27 DIAGNOSIS — L0390 Cellulitis, unspecified: Secondary | ICD-10-CM | POA: Diagnosis not present

## 2022-08-05 ENCOUNTER — Ambulatory Visit: Payer: PPO | Admitting: Cardiology

## 2022-08-05 ENCOUNTER — Encounter: Payer: Self-pay | Admitting: Cardiology

## 2022-08-05 VITALS — BP 135/60 | HR 70 | Resp 16 | Ht 63.0 in | Wt 135.0 lb

## 2022-08-05 DIAGNOSIS — E782 Mixed hyperlipidemia: Secondary | ICD-10-CM

## 2022-08-05 DIAGNOSIS — I10 Essential (primary) hypertension: Secondary | ICD-10-CM

## 2022-08-05 DIAGNOSIS — I25118 Atherosclerotic heart disease of native coronary artery with other forms of angina pectoris: Secondary | ICD-10-CM | POA: Diagnosis not present

## 2022-08-05 NOTE — Progress Notes (Signed)
Patient is here for follow up visit.  Subjective:   @Patient  ID: Angel Day, female    DOB: [birth year], 82 y.o.   MRN: [id number]   Chief Complaint  Patient presents with   Congestive Heart Failure   Coronary Artery Disease   Hypertension   Hyperlipidemia   Follow-up    6 months    HPI  82 y/o Caucasian female with hypertension, hyperlipidemia, prediabetes, mild nonobstructive CAD, CKD stage 3  Patient is doing well, denies chest pain, shortness of breath, palpitations, leg edema, orthopnea, PND, TIA/syncope.    Current Outpatient Medications:    aspirin 81 MG tablet, Take 81 mg by mouth. Sometimes, Disp: , Rfl:    furosemide (LASIX) 20 MG tablet, TAKE 1 TABLET BY MOUTH EVERY DAY AS NEEDED, Disp: 90 tablet, Rfl: 1   JARDIANCE 10 MG TABS tablet, Take 10 mg by mouth daily., Disp: , Rfl:    levothyroxine (SYNTHROID, LEVOTHROID) 50 MCG tablet, Take 50 mcg by mouth daily., Disp: , Rfl: 5   losartan (COZAAR) 100 MG tablet, Take 100 mg by mouth daily., Disp: , Rfl: 10   rosuvastatin (CRESTOR) 20 MG tablet, TAKE 1 TABLET BY MOUTH EVERY DAY, Disp: 90 tablet, Rfl: 1  Cardiovascular studies:  EKG 08/05/2022: Sinus rhythm 60 bpm  Nonspecific T-abnormality  Exercise Tetrofosmin stress test 11/16/2021: Exercise nuclear stress test was performed using Bruce protocol. Patient reached 7 METS, and 85% of age predicted maximum heart rate. Exercise capacity was low normal. No chest pain reported. Heart rate and hemodynamic response were normal. Stress EKG revealed no ischemic changes. Normal myocardial perfusion. Stress LVEF 59%. Low risk study. No significant change compared to previous study on 7/19/20221.  Echocardiogram 06/05/2020:  Left ventricle cavity is normal in size and wall thickness. Normal global  wall motion. Normal LV systolic function with EF 57%. Indeterminate  diastolic filling pattern.  Mild (Grade I) mitral regurgitation.  Mild to moderate tricuspid  regurgitation. Estimated pulmonary artery  systolic pressure 29 mmHg.  No significant change compared to previous study on 07/27/2018.  ABI 07/27/2018:  This exam reveals normal perfusion of the right lower extremity (ABI  1.00). Normal triphasic spectral waveforms of the right ankle.  Mildly decreased perfusion of the left lower extremity, noted at the  anterior tibial and post tibial artery level (ABI 0.96). Mildly abnormal  spectral waveforms of the left ankle.  CTA cor 07/12/2018: Coronaries: Ca score 309 (73rd percentile) LM: Calcified plaque at the ostium. No significant stenosis. LAD: Mixed plaque proximal and mid LAD, mild (<50%) stenosis.  LCx: Mixed plaque in mid LCx, minimal stenosis. RCA: Mixed plaque proximal and mid RCA with minimal stenosis.   Pulmonary veins drain normally to the left atrium.   IMPRESSION: 1. Coronary artery calcium score 309 Agatston units. This places the patient in the 73rd percentile for age and gender, suggesting intermediate risk for future cardiac events. 2.  Nonobstructive coronary disease. 3. Aortic atherosclerosis. 4. Small hiatal hernia.   Recent labs: 07/20/2022: Glucose 84, BUN/Cr 29/1.0. EGFR 47. Hb 11.9 HbA1C 6.4% Chol 120, TG 110, HDL 48, LDL 52 TSH 3.6 normal  05/26/2021: Glucose 98, BUN/Cr 36/1.3. EGFR 37. Na/K 143/5.1. Rest of the CMP normal H/H 13/39. MCV 89. Platelets 284 HbA1C 6.0% Chol 119, TG 145, HDL 40, LDL 50 TSH 5.1 normal   Review of Systems  Constitutional: Negative for malaise/fatigue.  Cardiovascular:  Negative for chest pain, dyspnea on exertion, leg swelling, palpitations and syncope.  Neurological:  Positive  for paresthesias.        Objective:    Vitals:   08/05/22 1500  BP: 135/60  Pulse: 70  Resp: 16  SpO2: 96%      Physical Exam Vitals and nursing note reviewed.  Constitutional:      General: She is not in acute distress. Neck:     Vascular: No JVD.  Cardiovascular:     Rate and  Rhythm: Normal rate and regular rhythm.     Pulses: Intact distal pulses.          Dorsalis pedis pulses are 1+ on the right side and 1+ on the left side.       Posterior tibial pulses are 2+ on the right side and 0 on the left side.     Heart sounds: Normal heart sounds. No murmur heard. Pulmonary:     Effort: Pulmonary effort is normal.     Breath sounds: Normal breath sounds. No wheezing or rales.  Musculoskeletal:     Right lower leg: No edema.     Left lower leg: No edema.         Assessment & Recommendations:    82 y/o Caucasian female with hypertension, hyperlipidemia, prediabetes, mild nonobstructive CAD, CKD stage 3  CAD:  No ischemia on tress testing (10/2021). Nonobstructive. Continue statin.  Now, also on Jardiance. Given nonobstructive CAD but mild PAD, continue Aspirin 81 mg. Okay to take every other day.  Hypertension: Controlled  Mixed hyperlipidemia: Well controlled  F/u in 1 year   Elder Negus, MD Pager: [phone] Office: [phone]

## 2023-01-06 DIAGNOSIS — E785 Hyperlipidemia, unspecified: Secondary | ICD-10-CM | POA: Diagnosis not present

## 2023-01-06 DIAGNOSIS — N1831 Chronic kidney disease, stage 3a: Secondary | ICD-10-CM | POA: Diagnosis not present

## 2023-01-06 DIAGNOSIS — I129 Hypertensive chronic kidney disease with stage 1 through stage 4 chronic kidney disease, or unspecified chronic kidney disease: Secondary | ICD-10-CM | POA: Diagnosis not present

## 2023-01-10 DIAGNOSIS — H2513 Age-related nuclear cataract, bilateral: Secondary | ICD-10-CM | POA: Diagnosis not present

## 2023-01-10 DIAGNOSIS — E119 Type 2 diabetes mellitus without complications: Secondary | ICD-10-CM | POA: Diagnosis not present

## 2023-01-13 DIAGNOSIS — I10 Essential (primary) hypertension: Secondary | ICD-10-CM | POA: Diagnosis not present

## 2023-01-13 DIAGNOSIS — E039 Hypothyroidism, unspecified: Secondary | ICD-10-CM | POA: Diagnosis not present

## 2023-01-13 DIAGNOSIS — E1122 Type 2 diabetes mellitus with diabetic chronic kidney disease: Secondary | ICD-10-CM | POA: Diagnosis not present

## 2023-01-13 DIAGNOSIS — Z23 Encounter for immunization: Secondary | ICD-10-CM | POA: Diagnosis not present

## 2023-01-13 DIAGNOSIS — N1831 Chronic kidney disease, stage 3a: Secondary | ICD-10-CM | POA: Diagnosis not present

## 2023-01-13 DIAGNOSIS — E785 Hyperlipidemia, unspecified: Secondary | ICD-10-CM | POA: Diagnosis not present

## 2023-04-03 ENCOUNTER — Other Ambulatory Visit: Payer: Self-pay | Admitting: Cardiology

## 2023-04-03 DIAGNOSIS — E785 Hyperlipidemia, unspecified: Secondary | ICD-10-CM

## 2023-05-18 DIAGNOSIS — N189 Chronic kidney disease, unspecified: Secondary | ICD-10-CM | POA: Diagnosis not present

## 2023-05-18 DIAGNOSIS — N3289 Other specified disorders of bladder: Secondary | ICD-10-CM | POA: Diagnosis not present

## 2023-05-18 DIAGNOSIS — E1122 Type 2 diabetes mellitus with diabetic chronic kidney disease: Secondary | ICD-10-CM | POA: Diagnosis not present

## 2023-05-18 DIAGNOSIS — N183 Chronic kidney disease, stage 3 unspecified: Secondary | ICD-10-CM | POA: Diagnosis not present

## 2023-05-18 DIAGNOSIS — I129 Hypertensive chronic kidney disease with stage 1 through stage 4 chronic kidney disease, or unspecified chronic kidney disease: Secondary | ICD-10-CM | POA: Diagnosis not present

## 2023-05-18 DIAGNOSIS — D631 Anemia in chronic kidney disease: Secondary | ICD-10-CM | POA: Diagnosis not present

## 2023-05-18 DIAGNOSIS — N2581 Secondary hyperparathyroidism of renal origin: Secondary | ICD-10-CM | POA: Diagnosis not present

## 2023-07-13 DIAGNOSIS — N1831 Chronic kidney disease, stage 3a: Secondary | ICD-10-CM | POA: Diagnosis not present

## 2023-07-13 DIAGNOSIS — I129 Hypertensive chronic kidney disease with stage 1 through stage 4 chronic kidney disease, or unspecified chronic kidney disease: Secondary | ICD-10-CM | POA: Diagnosis not present

## 2023-07-13 DIAGNOSIS — E785 Hyperlipidemia, unspecified: Secondary | ICD-10-CM | POA: Diagnosis not present

## 2023-07-20 DIAGNOSIS — E785 Hyperlipidemia, unspecified: Secondary | ICD-10-CM | POA: Diagnosis not present

## 2023-07-20 DIAGNOSIS — I10 Essential (primary) hypertension: Secondary | ICD-10-CM | POA: Diagnosis not present

## 2023-07-20 DIAGNOSIS — E1122 Type 2 diabetes mellitus with diabetic chronic kidney disease: Secondary | ICD-10-CM | POA: Diagnosis not present

## 2023-07-20 DIAGNOSIS — E039 Hypothyroidism, unspecified: Secondary | ICD-10-CM | POA: Diagnosis not present

## 2023-07-29 DIAGNOSIS — I2510 Atherosclerotic heart disease of native coronary artery without angina pectoris: Secondary | ICD-10-CM | POA: Diagnosis not present

## 2023-07-29 DIAGNOSIS — N1832 Chronic kidney disease, stage 3b: Secondary | ICD-10-CM | POA: Diagnosis not present

## 2023-07-29 DIAGNOSIS — E785 Hyperlipidemia, unspecified: Secondary | ICD-10-CM | POA: Diagnosis not present

## 2023-07-29 DIAGNOSIS — E1122 Type 2 diabetes mellitus with diabetic chronic kidney disease: Secondary | ICD-10-CM | POA: Diagnosis not present

## 2023-07-29 DIAGNOSIS — I129 Hypertensive chronic kidney disease with stage 1 through stage 4 chronic kidney disease, or unspecified chronic kidney disease: Secondary | ICD-10-CM | POA: Diagnosis not present

## 2023-07-29 DIAGNOSIS — K219 Gastro-esophageal reflux disease without esophagitis: Secondary | ICD-10-CM | POA: Diagnosis not present

## 2023-07-29 DIAGNOSIS — Z0000 Encounter for general adult medical examination without abnormal findings: Secondary | ICD-10-CM | POA: Diagnosis not present

## 2023-07-29 DIAGNOSIS — E039 Hypothyroidism, unspecified: Secondary | ICD-10-CM | POA: Diagnosis not present

## 2023-08-05 ENCOUNTER — Ambulatory Visit: Payer: Self-pay | Admitting: Cardiology

## 2023-09-08 ENCOUNTER — Ambulatory Visit (HOSPITAL_COMMUNITY)
Admission: EM | Admit: 2023-09-08 | Discharge: 2023-09-08 | Disposition: A | Discharge status: Home / Self Care | Attending: Internal Medicine | Admitting: Internal Medicine

## 2023-09-08 ENCOUNTER — Encounter (HOSPITAL_COMMUNITY): Payer: Self-pay

## 2023-09-08 ENCOUNTER — Telehealth: Payer: Self-pay | Admitting: Cardiology

## 2023-09-08 DIAGNOSIS — I83811 Varicose veins of right lower extremities with pain: Secondary | ICD-10-CM

## 2023-09-08 DIAGNOSIS — M79604 Pain in right leg: Secondary | ICD-10-CM | POA: Diagnosis not present

## 2023-09-08 NOTE — Telephone Encounter (Signed)
 Patient says she has been having pain in her right leg from the knee down and it feels warm to touch below her knee. This has been occurring the past few days. No issues in left leg. No other symptoms. She mentions she has still been taking baby Aspirin  every night.

## 2023-09-08 NOTE — ED Triage Notes (Signed)
 Patient reports tht she has a varicose vein of the right shin and states for the past 2-3 days she has had intermittent discomfort, feeling hot and stinging. Patient called her physician and was told to go to a walk-in clinic.

## 2023-09-08 NOTE — ED Provider Notes (Signed)
 [facility]    CSN: [id number] Arrival date & time: 09/08/23  1734      History   Chief Complaint Chief Complaint  Patient presents with   vein issue    HPI Angel STROUGH is a 83 y.o. female.   83 year old female who presents urgent care with complaints of right anterior leg pain along the shin.  This started about 2 or 3 days ago.  She first noticed some tenderness in the area then noticed a swollen vein.  The area feels like a bruise but there is no bruise there.  She is not having any swelling in the leg.  She has been sitting with her sister who has been ill lately.  She denies any shortness of breath or chest pain.  She did contact her cardiologist who advised coming to urgent care for further evaluation.  She has been taking an aspirin  for the pain.  She has not had a history of deep vein thrombosis in the past.  She has never noticed a larger vein on the anterior shin.  The patient also relates that she has felt some warmth on her shin but no redness.  She denies fevers or chills.     Past Medical History:  Diagnosis Date   Depression    Diabetes (HCC)    High cholesterol    Hypertension     Patient Active Problem List   Diagnosis Date Noted   Abnormal pulse 01/02/2021   Paresthesia 01/02/2021   Coronary artery disease of native artery of native heart with stable angina pectoris (HCC) 08/22/2019   Mixed hyperlipidemia 02/02/2019   Exertional dyspnea 06/28/2018   Pain in both lower extremities 05/11/2018   Leg edema 05/11/2018   C. difficile diarrhea 05/09/2017   Facial twitching 03/31/2015   Tinnitus 03/31/2015   Essential hypertension 03/31/2015   Impaired glucose tolerance 03/31/2015    Past Surgical History:  Procedure Laterality Date   BREAST REDUCTION SURGERY  1980   REDUCTION MAMMAPLASTY Bilateral    REFRACTIVE SURGERY  1991   VAGINAL HYSTERECTOMY  1992    OB History   No obstetric history on file.      Home Medications     Prior to Admission medications   Medication Sig Start Date End Date Taking? Authorizing Provider  aspirin  81 MG tablet Take 81 mg by mouth. Sometimes    [provider]  furosemide  (LASIX ) 20 MG tablet TAKE 1 TABLET BY MOUTH EVERY DAY AS NEEDED 03/30/21   Patwardhan, Manish J, MD  JARDIANCE 10 MG TABS tablet Take 10 mg by mouth daily.    [provider]  levothyroxine  (SYNTHROID , LEVOTHROID) 50 MCG tablet Take 50 mcg by mouth daily. 03/02/17   [provider]  losartan (COZAAR) 100 MG tablet Take 100 mg by mouth daily. 04/13/17   [provider]  rosuvastatin  (CRESTOR ) 20 MG tablet TAKE 1 TABLET BY MOUTH EVERY DAY 04/06/23   Patwardhan, Newman PARAS, MD    Family History Family History  Problem Relation Age of Onset   Breast cancer Mother    Cancer Mother        sarcoma   Heart attack Father    Heart attack Brother     Social History Social History   Tobacco Use   Smoking status: Former    Current packs/day: 0.00    Average packs/day: 0.5 packs/day for 10.0 years (5.0 ttl pk-yrs)    Types: Cigarettes    Start date: 03/01/1965  Quit date: 03/02/1975    Years since quitting: 48.5   Smokeless tobacco: Never  Vaping Use   Vaping status: Never Used  Substance Use Topics   Alcohol use: No    Alcohol/week: 0.0 standard drinks of alcohol   Drug use: No     Allergies   Penicillins   Review of Systems Review of Systems  Constitutional:  Negative for chills and fever.  HENT:  Negative for ear pain and sore throat.   Eyes:  Negative for pain and visual disturbance.  Respiratory:  Negative for cough and shortness of breath.   Cardiovascular:  Negative for chest pain and palpitations.  Gastrointestinal:  Negative for abdominal pain and vomiting.  Genitourinary:  Negative for dysuria and hematuria.  Musculoskeletal:  Negative for arthralgias and back pain.       Pain on the anterior right leg, visible varicose vein  Skin:  Negative for color change  and rash.  Neurological:  Negative for seizures and syncope.  All other systems reviewed and are negative.    Physical Exam Triage Vital Signs ED Triage Vitals [09/08/23 1900]  Encounter Vitals Group     BP (!) 148/77     Girls Systolic BP Percentile      Girls Diastolic BP Percentile      Boys Systolic BP Percentile      Boys Diastolic BP Percentile      Pulse Rate 64     Resp 16     Temp (!) 97.4 F (36.3 C)     Temp Source Oral     SpO2 95 %     Weight      Height      Head Circumference      Peak Flow      Pain Score      Pain Loc      Pain Education      Exclude from Growth Chart    No data found.  Updated Vital Signs BP (!) 148/77 (BP Location: Left Arm)   Pulse 64   Temp (!) 97.4 F (36.3 C) (Oral)   Resp 16   SpO2 95%   Visual Acuity Right Eye Distance:   Left Eye Distance:   Bilateral Distance:    Right Eye Near:   Left Eye Near:    Bilateral Near:     Physical Exam Vitals and nursing note reviewed.  Constitutional:      General: She is not in acute distress.    Appearance: She is well-developed.  HENT:     Head: Normocephalic and atraumatic.  Eyes:     Conjunctiva/sclera: Conjunctivae normal.  Cardiovascular:     Rate and Rhythm: Normal rate and regular rhythm.     Pulses:          Dorsalis pedis pulses are 2+ on the right side.       Posterior tibial pulses are 2+ on the right side.     Heart sounds: No murmur heard. Pulmonary:     Effort: Pulmonary effort is normal. No respiratory distress.     Breath sounds: Normal breath sounds.  Abdominal:     Palpations: Abdomen is soft.     Tenderness: There is no abdominal tenderness.  Musculoskeletal:        General: No swelling.     Cervical back: Neck supple.       Legs:  Skin:    General: Skin is warm and dry.     Capillary Refill: Capillary refill  takes less than 2 seconds.  Neurological:     Mental Status: She is alert.  Psychiatric:        Mood and Affect: Mood normal.       UC Treatments / Results  Labs (all labs ordered are listed, but only abnormal results are displayed) Labs Reviewed - No data to display  EKG   Radiology No results found.  Procedures Procedures (including critical care time)  Medications Ordered in UC Medications - No data to display  Initial Impression / Assessment and Plan / UC Course  I have reviewed the triage vital signs and the nursing notes.  Pertinent labs & imaging results that were available during my care of the patient were reviewed by me and considered in my medical decision making (see chart for details).     Right leg pain  Varicose veins of leg with pain, right   Right anterior leg symptoms likely secondary to inflammation in the superficial veins in the leg, possibly a superficial thrombophlebitis which is a self-limiting condition.  Less likely is a deep vein thrombosis but would like to rule this out.  We will order an outpatient ultrasound to be done tomorrow.  This will look for a deep vein thrombosis and may be able to see if there is a superficial thrombophlebitis present.  If a deep vein thrombosis is present then further advanced treatment will be needed.  If there is no deep vein thrombosis but there is superficial thrombophlebitis, this is self-limiting and can be treated with warm compresses, elevation and continuing aspirin  81 mg daily.  Keep follow-up appointment as scheduled with cardiology.  Final Clinical Impressions(s) / UC Diagnoses   Final diagnoses:  Right leg pain  Varicose veins of leg with pain, right     Discharge Instructions      Right anterior leg symptoms likely secondary to inflammation in the superficial veins in the leg, possibly a superficial thrombophlebitis which is a self-limiting condition.  Less likely is a deep vein thrombosis but would like to rule this out.  We will order an outpatient ultrasound to be done tomorrow.  This will look for a deep vein thrombosis  and may be able to see if there is a superficial thrombophlebitis present.  If a deep vein thrombosis is present then further advanced treatment will be needed.  If there is no deep vein thrombosis but there is superficial thrombophlebitis, this is self-limiting and can be treated with warm compresses, elevation and continuing aspirin  81 mg daily.  Keep follow-up appointment as scheduled with cardiology.    ED Prescriptions   None    PDMP not reviewed this encounter.   Angel Day LABOR, [place] 09/08/23 1955

## 2023-09-08 NOTE — Telephone Encounter (Signed)
 Noted.  Thanks MJP

## 2023-09-08 NOTE — Telephone Encounter (Signed)
 Spoke to patient she stated she has been having pain in right lower leg for the past 2 days.Stated leg hot to touch.Advised office is closed at present.Stated she will go to a Urgent Care now.Appointment scheduled with Dr.Patwardhan 7/22 at 2:30 pm.

## 2023-09-08 NOTE — Discharge Instructions (Addendum)
 Right anterior leg symptoms likely secondary to inflammation in the superficial veins in the leg, possibly a superficial thrombophlebitis which is a self-limiting condition.  Less likely is a deep vein thrombosis but would like to rule this out.  We will order an outpatient ultrasound to be done tomorrow.  This will look for a deep vein thrombosis and may be able to see if there is a superficial thrombophlebitis present.  If a deep vein thrombosis is present then further advanced treatment will be needed.  If there is no deep vein thrombosis but there is superficial thrombophlebitis, this is self-limiting and can be treated with warm compresses, elevation and continuing aspirin  81 mg daily.  Keep follow-up appointment as scheduled with cardiology.

## 2023-09-09 ENCOUNTER — Ambulatory Visit (HOSPITAL_COMMUNITY): Payer: Self-pay

## 2023-09-09 ENCOUNTER — Ambulatory Visit (HOSPITAL_COMMUNITY)
Admission: RE | Admit: 2023-09-09 | Discharge: 2023-09-09 | Disposition: A | Discharge status: Home / Self Care | Source: Ambulatory Visit | Attending: Internal Medicine | Admitting: Internal Medicine

## 2023-09-09 DIAGNOSIS — M7989 Other specified soft tissue disorders: Secondary | ICD-10-CM | POA: Insufficient documentation

## 2023-09-20 ENCOUNTER — Encounter: Payer: Self-pay | Admitting: Cardiology

## 2023-09-20 ENCOUNTER — Ambulatory Visit: Attending: Cardiology | Admitting: Cardiology

## 2023-09-20 VITALS — BP 120/66 | HR 63 | Ht 63.0 in | Wt 134.0 lb

## 2023-09-20 DIAGNOSIS — E782 Mixed hyperlipidemia: Secondary | ICD-10-CM

## 2023-09-20 DIAGNOSIS — I10 Essential (primary) hypertension: Secondary | ICD-10-CM | POA: Diagnosis not present

## 2023-09-20 DIAGNOSIS — I25118 Atherosclerotic heart disease of native coronary artery with other forms of angina pectoris: Secondary | ICD-10-CM | POA: Diagnosis not present

## 2023-09-20 NOTE — Progress Notes (Signed)
 Cardiology Office Note:  .   Date:  09/20/2023  ID:  Madeline LITTIE Minus, DOB February 28, 1941, MRN [id number] PCP: Gerome Brunet, DO  [facility] Providers Cardiologist:  Newman Lawrence, MD PCP: Gerome Brunet, DO  Chief Complaint  Patient presents with   Coronary Artery Disease     Angel Day is a 83 y.o. female with hypertension, hyperlipidemia, prediabetes, mild nonobstructive CAD (CTA 2020), CKD stage 3   History of Present Illness   Patient recently had episodes of right leg pain.  She reportedly underwent ultrasound testing that ruled out DVT.  She denies any exertional chest pain, claudication, dyspnea symptoms.  Blood pressure is well-controlled.  Reviewed recent lab results with the patient, discussed with her.    Vitals:   09/20/23 1422  BP: 120/66  Pulse: 63  SpO2: 97%      Review of Systems  Cardiovascular:  Negative for chest pain, dyspnea on exertion, leg swelling, palpitations and syncope.        Studies Reviewed: SABRA        EKG 09/20/2023: Normal sinus rhythm Nonspecific T wave abnormality When compared with ECG of 09-May-2017 23:00, No significant change was found    Labs 06/2023: Chol 139, TG 145, HDL 46, LDL 68 HbA1C 6.4% Hb 12.8 Cr 1.0 TSH 1.7  Exercise Tetrofosmin  stress test 11/16/2021: Exercise nuclear stress test was performed using Bruce protocol. Patient reached 7 METS, and 85% of age predicted maximum heart rate. Exercise capacity was low normal. No chest pain reported. Heart rate and hemodynamic response were normal. Stress EKG revealed no ischemic changes. Normal myocardial perfusion. Stress LVEF 59%. Low risk study. No significant change compared to previous study on 7/19/20221.   Echocardiogram 06/05/2020:  Left ventricle cavity is normal in size and wall thickness. Normal global  wall motion. Normal LV systolic function with EF 57%. Indeterminate  diastolic filling pattern.  Mild (Grade I) mitral regurgitation.  Mild  to moderate tricuspid regurgitation. Estimated pulmonary artery  systolic pressure 29 mmHg.  No significant change compared to previous study on 07/27/2018.   ABI 07/27/2018:  This exam reveals normal perfusion of the right lower extremity (ABI  1.00). Normal triphasic spectral waveforms of the right ankle.  Mildly decreased perfusion of the left lower extremity, noted at the  anterior tibial and post tibial artery level (ABI 0.96). Mildly abnormal  spectral waveforms of the left ankle.   CTA cor 07/12/2018: Coronaries: Ca score 309 (73rd percentile) LM: Calcified plaque at the ostium. No significant stenosis. LAD: Mixed plaque proximal and mid LAD, mild (<50%) stenosis.  LCx: Mixed plaque in mid LCx, minimal stenosis. RCA: Mixed plaque proximal and mid RCA with minimal stenosis.   Pulmonary veins drain normally to the left atrium.   IMPRESSION: 1. Coronary artery calcium  score 309 Agatston units. This places the patient in the 73rd percentile for age and gender, suggesting intermediate risk for future cardiac events. 2.  Nonobstructive coronary disease. 3. Aortic atherosclerosis. 4. Small hiatal hernia.     Physical Exam Vitals and nursing note reviewed.  Constitutional:      General: She is not in acute distress. Neck:     Vascular: No JVD.  Cardiovascular:     Rate and Rhythm: Normal rate and regular rhythm.     Heart sounds: Normal heart sounds. No murmur heard. Pulmonary:     Effort: Pulmonary effort is normal.     Breath sounds: Normal breath sounds. No wheezing or rales.  Musculoskeletal:  Right lower leg: No edema.     Left lower leg: No edema.      VISIT DIAGNOSES:   ICD-10-CM   1. Coronary artery disease of native artery of native heart with stable angina pectoris (HCC)  I25.118 EKG 12-Lead    2. Mixed hyperlipidemia  E78.2 EKG 12-Lead    3. Essential hypertension  I10 EKG 12-Lead       BRISEIS AGUILERA is a 83 y.o. female with hypertension,  hyperlipidemia, prediabetes, mild nonobstructive CAD (CTA 2020), CKD stage 3   Assessment & Plan  CAD:  Nonobstructive CAD (CTA 2020). Continue Aspirin , statin.    Hypertension: Controlled   Mixed hyperlipidemia: Well controlled   Leg pain: Resolved. Normal arterial circulation on exam. No further tests necessary.    F/u in 1 year  Signed, Newman JINNY Lawrence, MD

## 2023-09-20 NOTE — Patient Instructions (Signed)
 Follow-Up: At [facility], you and your health needs are our priority.  As part of our continuing mission to provide you with exceptional heart care, our providers are all part of one team.  This team includes your primary Cardiologist (physician) and Advanced Practice Providers or APPs (Physician Assistants and Nurse Practitioners) who all work together to provide you with the care you need, when you need it.  Your next appointment:   1 year(s)  Provider:   Cody Das, MD

## 2023-11-03 ENCOUNTER — Other Ambulatory Visit: Payer: Self-pay

## 2023-11-03 DIAGNOSIS — E785 Hyperlipidemia, unspecified: Secondary | ICD-10-CM

## 2023-11-03 MED ORDER — ROSUVASTATIN CALCIUM 20 MG PO TABS: 20.0000 mg | ORAL_TABLET | Freq: Every day | ORAL | 2 refills | Status: AC

## 2023-12-27 ENCOUNTER — Telehealth (HOSPITAL_COMMUNITY): Payer: Self-pay | Admitting: Emergency Medicine

## 2023-12-27 ENCOUNTER — Other Ambulatory Visit: Payer: Self-pay

## 2023-12-27 ENCOUNTER — Emergency Department (HOSPITAL_COMMUNITY)

## 2023-12-27 ENCOUNTER — Emergency Department (HOSPITAL_COMMUNITY): Admission: EM | Admit: 2023-12-27 | Discharge: 2023-12-27 | Disposition: A | Discharge status: Home / Self Care

## 2023-12-27 ENCOUNTER — Encounter (HOSPITAL_COMMUNITY): Payer: Self-pay | Admitting: Emergency Medicine

## 2023-12-27 DIAGNOSIS — X501XXA Overexertion from prolonged static or awkward postures, initial encounter: Secondary | ICD-10-CM | POA: Diagnosis not present

## 2023-12-27 DIAGNOSIS — I700 Atherosclerosis of aorta: Secondary | ICD-10-CM | POA: Diagnosis not present

## 2023-12-27 DIAGNOSIS — S39012A Strain of muscle, fascia and tendon of lower back, initial encounter: Secondary | ICD-10-CM | POA: Insufficient documentation

## 2023-12-27 DIAGNOSIS — I10 Essential (primary) hypertension: Secondary | ICD-10-CM | POA: Diagnosis not present

## 2023-12-27 DIAGNOSIS — E119 Type 2 diabetes mellitus without complications: Secondary | ICD-10-CM | POA: Insufficient documentation

## 2023-12-27 DIAGNOSIS — Z79899 Other long term (current) drug therapy: Secondary | ICD-10-CM | POA: Insufficient documentation

## 2023-12-27 DIAGNOSIS — M51369 Other intervertebral disc degeneration, lumbar region without mention of lumbar back pain or lower extremity pain: Secondary | ICD-10-CM | POA: Diagnosis not present

## 2023-12-27 DIAGNOSIS — Z7982 Long term (current) use of aspirin: Secondary | ICD-10-CM | POA: Diagnosis not present

## 2023-12-27 DIAGNOSIS — M48061 Spinal stenosis, lumbar region without neurogenic claudication: Secondary | ICD-10-CM | POA: Diagnosis not present

## 2023-12-27 DIAGNOSIS — S79912A Unspecified injury of left hip, initial encounter: Secondary | ICD-10-CM | POA: Diagnosis not present

## 2023-12-27 DIAGNOSIS — M549 Dorsalgia, unspecified: Secondary | ICD-10-CM | POA: Diagnosis not present

## 2023-12-27 DIAGNOSIS — M545 Low back pain, unspecified: Secondary | ICD-10-CM | POA: Diagnosis present

## 2023-12-27 MED ORDER — TRAMADOL HCL 50 MG PO TABS: 50.0000 mg | ORAL_TABLET | Freq: Four times a day (QID) | ORAL | 0 refills | Status: AC | PRN

## 2023-12-27 MED ORDER — TRAMADOL HCL 50 MG PO TABS
50.0000 mg | ORAL_TABLET | Freq: Once | ORAL | Status: AC
  Administered 2023-12-27: 50 mg via ORAL
  Filled 2023-12-27: qty 1

## 2023-12-27 MED ORDER — TRAMADOL HCL 50 MG PO TABS: 50.0000 mg | ORAL_TABLET | Freq: Four times a day (QID) | ORAL | 0 refills | Status: DC | PRN

## 2023-12-27 NOTE — Discharge Instructions (Addendum)
 You have been evaluated for your fall.  Fortunately x-ray today did not show any broken bone or dislocation.  Your pain is likely a muscle strain.  You may take Tylenol  for pain but if the pain became intense you may take tramadol that you are aware it can cause drowsiness.  Alternate between heat and ice to help you with pain control.  Follow-up with orthopedic specialist as needed.

## 2023-12-27 NOTE — ED Triage Notes (Signed)
 Pt reports slipping and twisting her lower back this morning. Denies hitting her head.

## 2023-12-27 NOTE — ED Provider Notes (Signed)
 [place] EMERGENCY DEPARTMENT AT [facility] Provider Note   CSN: [id number] Arrival date & time: 12/27/23  1354     Patient presents with: Back Pain   Angel Day is a 83 y.o. female.   The history is provided by the patient and medical records. No language interpreter was used.  Back Pain Associated symptoms: no fever      83 year old female history of hypertension, diabetes presenting for evaluation of lower back pain.  Patient states this morning she went out to her mailbox, slipped on wet ground and twisted her back and fell to the ground.  She denies hitting her head or loss of consciousness.  She was able to get up on her own.  She is endorsing pain to her lower back.  Pain described as a sharp sensation across her lower back worse with movement.  She tries taking Tylenol  along with heat and ice at home without adequate relief.  She denies any numbness or weakness denies any bowel bladder incontinence saddle anesthesia denies any other injury denies headache.  She is not on any blood thinning medication.  Prior to Admission medications   Medication Sig Start Date End Date Taking? Authorizing Provider  aspirin  81 MG tablet Take 81 mg by mouth. Sometimes    [provider]  JARDIANCE 10 MG TABS tablet Take 10 mg by mouth daily.    [provider]  levothyroxine  (SYNTHROID , LEVOTHROID) 50 MCG tablet Take 50 mcg by mouth daily. 03/02/17   [provider]  losartan (COZAAR) 100 MG tablet Take 100 mg by mouth daily. 04/13/17   [provider]  rosuvastatin  (CRESTOR ) 20 MG tablet Take 1 tablet (20 mg total) by mouth daily. 11/03/23   Patwardhan, Newman PARAS, MD    Allergies: Penicillins    Review of Systems  Constitutional:  Negative for fever.  Musculoskeletal:  Positive for back pain.    Updated Vital Signs BP (!) 170/71 (BP Location: Left Arm)   Pulse 63   Temp 97.8 F (36.6 C) (Oral)   Resp 16   SpO2 98%   Physical  Exam Vitals and nursing note reviewed.  Constitutional:      General: She is not in acute distress.    Appearance: She is well-developed.  HENT:     Head: Atraumatic.  Eyes:     Conjunctiva/sclera: Conjunctivae normal.  Pulmonary:     Effort: Pulmonary effort is normal.  Abdominal:     Palpations: Abdomen is soft.     Tenderness: There is no abdominal tenderness.  Musculoskeletal:        General: Tenderness (Tenderness lumbar and paralumbar spinal muscle on palpation without any bruising no crepitus no step-off.) present.     Cervical back: Neck supple.     Comments: No tenderness to knees hips or ankles  Skin:    Findings: No rash.  Neurological:     Mental Status: She is alert and oriented to person, place, and time.  Psychiatric:        Mood and Affect: Mood normal.     (all labs ordered are listed, but only abnormal results are displayed) Labs Reviewed - No data to display  EKG: None  Radiology: DG Hip Unilat W or Wo Pelvis 2-3 Views Left Result Date: 12/27/2023 CLINICAL DATA:  Twisting injury, fall.  Pain. EXAM: DG HIP (WITH OR WITHOUT PELVIS) 2-3V*L* COMPARISON:  Pelvis and right hip radiograph 08/07/2020 FINDINGS: No acute fracture of the pelvis or left hip.  No hip dislocation. Bilateral hip joint space narrowing and acetabular spurring, without significant progression from prior. No pubic symphyseal or sacroiliac diastasis. Scattered phleboliths and gluteal granuloma. IMPRESSION: 1. No acute fracture or dislocation of the pelvis or left hip. 2. Mild bilateral hip osteoarthritis. Electronically Signed   By: Andrea Gasman M.D.   On: 12/27/2023 15:42   DG Lumbar Spine Complete Result Date: 12/27/2023 CLINICAL DATA:  Twisting injury, back pain. EXAM: LUMBAR SPINE - COMPLETE 4+ VIEW COMPARISON:  01/18/2017 FINDINGS: Five non-rib-bearing lumbar vertebra. Stable broad-based levo scoliotic curvature. Unchanged straightening of normal lordosis. No acute fracture or  compression deformity. Diffuse disc space narrowing and endplate spurring. Progressive degenerative changes with increased disc space narrowing and Modic endplate changes at L4-L5 from prior. Multilevel facet hypertrophy. No visible pars defects. Aortic atherosclerosis. IMPRESSION: 1. No acute fracture or subluxation of the lumbar spine. 2. Multilevel degenerative disc disease, progressive at L4-L5 from 2018. 3. Multilevel facet hypertrophy. Electronically Signed   By: Andrea Gasman M.D.   On: 12/27/2023 15:41     Procedures   Medications Ordered in the ED  traMADol (ULTRAM) tablet 50 mg (50 mg Oral Given 12/27/23 1620)                                    Medical Decision Making Amount and/or Complexity of Data Reviewed Radiology: ordered.  Risk Prescription drug management.   BP (!) 170/71 (BP Location: Left Arm)   Pulse 63   Temp 97.8 F (36.6 C) (Oral)   Resp 16   SpO2 98%   63:72 PM 83 year old female history of hypertension, diabetes presenting for evaluation of lower back pain.  Patient states this morning she went out to her mailbox, slipped on wet ground and twisted her back and fell to the ground.  She denies hitting her head or loss of consciousness.  She was able to get up on her own.  She is endorsing pain to her lower back.  Pain described as a sharp sensation across her lower back worse with movement.  She tries taking Tylenol  along with heat and ice at home without adequate relief.  She denies any numbness or weakness denies any bowel bladder incontinence saddle anesthesia denies any other injury denies headache.  She is not on any blood thinning medication.  On exam patient has tenderness to the lumbar and paralumbar spinal muscle on palpation.  No overlying skin changes.  No tenderness to hips ankles knees.  X-rays ordered.  X-ray of left hip and lumbar spine was obtained independently viewed interpreted by me and fortunately did not show any evidence of any acute  fracture or dislocation aside from degenerative changes.  I agree with radiology interpretation.  Patient was able to ambulate unassisted.  Patient report improvement of pain with tramadol.  Will prescribe a short course of tramadol for pain control but recommend patient to follow-up with orthopedic specialist as needed with PCP for further care.  RICE therapy discussed.  Return precaution given.     Final diagnoses:  Strain of lumbar region, initial encounter    ED Discharge Orders          Ordered    traMADol (ULTRAM) 50 MG tablet  Every 6 hours PRN        12/27/23 1633               Nivia Colon, PA-C 12/27/23 1653  Gennaro Duwaine CROME, DO 01/05/24 [phone]

## 2023-12-27 NOTE — ED Notes (Signed)
PT ambulated

## 2024-01-06 DIAGNOSIS — M47816 Spondylosis without myelopathy or radiculopathy, lumbar region: Secondary | ICD-10-CM | POA: Diagnosis not present

## 2024-01-17 DIAGNOSIS — M545 Low back pain, unspecified: Secondary | ICD-10-CM | POA: Diagnosis not present

## 2024-01-23 DIAGNOSIS — E1122 Type 2 diabetes mellitus with diabetic chronic kidney disease: Secondary | ICD-10-CM | POA: Diagnosis not present

## 2024-01-23 DIAGNOSIS — E7800 Pure hypercholesterolemia, unspecified: Secondary | ICD-10-CM | POA: Diagnosis not present

## 2024-01-23 DIAGNOSIS — E039 Hypothyroidism, unspecified: Secondary | ICD-10-CM | POA: Diagnosis not present

## 2024-01-23 DIAGNOSIS — E559 Vitamin D deficiency, unspecified: Secondary | ICD-10-CM | POA: Diagnosis not present

## 2024-01-23 DIAGNOSIS — N1832 Chronic kidney disease, stage 3b: Secondary | ICD-10-CM | POA: Diagnosis not present

## 2024-01-23 DIAGNOSIS — I10 Essential (primary) hypertension: Secondary | ICD-10-CM | POA: Diagnosis not present
